# Patient Record
Sex: Female | Born: 1975 | Race: White | Hispanic: No | Marital: Single | Smoking: Current every day smoker
Health system: Southern US, Community
[De-identification: ages and names within clinical notes are randomized; demographics above are authoritative.]

## PROBLEM LIST (undated history)

## (undated) DIAGNOSIS — F419 Anxiety disorder, unspecified: Secondary | ICD-10-CM

## (undated) HISTORY — DX: Anxiety disorder, unspecified: F41.9

---

## 2008-03-02 HISTORY — PX: COLPOSCOPY: SHX161

## 2015-02-18 ENCOUNTER — Ambulatory Visit
Admission: EM | Admit: 2015-02-18 | Discharge: 2015-02-18 | Disposition: A | Discharge status: Home / Self Care | Payer: BLUE CROSS/BLUE SHIELD | Attending: Emergency Medicine | Admitting: Emergency Medicine

## 2015-02-18 DIAGNOSIS — S0501XA Injury of conjunctiva and corneal abrasion without foreign body, right eye, initial encounter: Secondary | ICD-10-CM | POA: Diagnosis not present

## 2015-02-18 MED ORDER — TETRACAINE HCL 0.5 % OP SOLN
2.0000 [drp] | Freq: Once | OPHTHALMIC | Status: AC
  Administered 2015-02-18: 2 [drp] via OPHTHALMIC

## 2015-02-18 MED ORDER — FLUORESCEIN SODIUM 1 MG OP STRP
1.0000 | ORAL_STRIP | Freq: Once | OPHTHALMIC | Status: AC
  Administered 2015-02-18: 1 via OPHTHALMIC

## 2015-02-18 MED ORDER — ERYTHROMYCIN 5 MG/GM OP OINT: 1.0000 "application " | TOPICAL_OINTMENT | Freq: Four times a day (QID) | OPHTHALMIC | Status: DC

## 2015-02-18 MED ORDER — TETANUS-DIPHTH-ACELL PERTUSSIS 5-2.5-18.5 LF-MCG/0.5 IM SUSP
0.5000 mL | Freq: Once | INTRAMUSCULAR | Status: AC
  Administered 2015-02-18: 0.5 mL via INTRAMUSCULAR

## 2015-02-18 NOTE — ED Provider Notes (Signed)
[facility]  ____________________________________________  Time seen: Approximately 1:33 PM  I have reviewed the triage vital signs and the nursing notes.   HISTORY  Chief Complaint Eye Problem   HPI Wendy Sexton is a 39 y.o. female presents with complaint of right eye redness and irritation. Patient reports that onset of this was Saturday evening. Patient states that she's been having some issues with her contacts causing her eyes to feel dry and more irritated than normal. Patient states that this is been going on for several months and she has been seeing her eye doctor for the same. Patient states that Saturday her eyes did feel dry and irritated with contact like normal, so she went to remove the right contact. Patient states that because her eye being more dry than normal she had some difficulty removing the contact. Patient states that after removal of the contact she had more right eye irritation and redness. States it feels like her eyes watering more than normal. Denies eye pain but states again is more of an irritation. Denies light sensitivity. Denies foreign body sensation. States current right eye discomfort is 2 out of 10 described as irritation.  Denies vision changes, foreign bodies, headache, chemical exposures or sick contact exposures. Denies headache, neck pain, back pain or other complaints. Reports unsure of last tetanus immunization.  Patient again noted that she has had similar current the past.  Ophthalmologist: ritter   History reviewed. No pertinent past medical history.  There are no active problems to display for this patient.   History reviewed. No pertinent past surgical history.  Current Outpatient Rx  Name  Route  Sig  Dispense  Refill  . ALPRAZolam (XANAX) 0.5 MG tablet   Oral   Take 0.5 mg by mouth at bedtime as needed for anxiety.           Allergies Review of patient's allergies indicates no known allergies.  Family History   Problem Relation Age of Onset  . Cancer Father     Social History Social History  Substance Use Topics  . Smoking status: Former Games developer  . Smokeless tobacco: None  . Alcohol Use: Yes     Comment: socially    Review of Systems Constitutional: No fever/chills Eyes: No visual changes. Right eye redness and irritation ENT: No sore throat. Cardiovascular: Denies chest pain. Respiratory: Denies shortness of breath. Gastrointestinal: No abdominal pain.  No nausea, no vomiting.  No diarrhea.  No constipation. Genitourinary: Negative for dysuria. Musculoskeletal: Negative for back pain. Skin: Negative for rash. Neurological: Negative for headaches, focal weakness or numbness.  10-point ROS otherwise negative.  ____________________________________________   PHYSICAL EXAM:  VITAL SIGNS: ED Triage Vitals  Enc Vitals Group     BP 02/18/15 1255 126/60 mmHg     Pulse Rate 02/18/15 1255 103 Recheck 84     Resp 02/18/15 1255 16     Temp 02/18/15 1255 97.5 F (36.4 C)     Temp Source 02/18/15 1255 Tympanic     SpO2 02/18/15 1255 100 %     Weight 02/18/15 1255 180 lb (81.647 kg)     Height 02/18/15 1255  (1.676 m)     Head Cir --      Peak Flow --      Pain Score 02/18/15 1257 4     Pain Loc --      Pain Edu? --      Excl. in GC? --    Visual acuity: Right 20/40,  left 20/40 With correction.   Constitutional: Alert and oriented. Well appearing and in no acute distress. Eyes: Visual acuity--see nursing documentation; No globe trauma; Eyelids normal to inspection;  Conjunctiva and sclera: Right: mild injection, Left: normal; Corneas: right examined with fluorescein with right corneal abrasion noted at 7 o'clock; EOM's intact;no pain with EOMs;  Pupils PERRLA; Anterior Chambers normal with limited exam. No surrounding erythema or swelling. No exudate or drainage bilaterally. No papilledema visualized bilaterally.  Head: Atraumatic.  Ears: no erythema, normal TMs  bilaterally.   Nose: No congestion/rhinnorhea.  Mouth/Throat: Mucous membranes are moist.  Oropharynx non-erythematous.No tonsillar swelling or exudate.  Neck: No stridor.  No cervical spine tenderness to palpation Hematological/Lymphatic/Immunilogical: No cervical lymphadenopathy. Cardiovascular: Normal rate, regular rhythm. Grossly normal heart sounds.  Good peripheral circulation. Respiratory: Normal respiratory effort.  No retractions. Lungs CTAB. Gastrointestinal: Soft and nontender. No distention. Normal Bowel sounds.   Musculoskeletal: No lower or upper extremity tenderness nor edema.   Neurologic:  Normal speech and language. No gross focal neurologic deficits are appreciated. No gait instability. Skin:  Skin is warm, dry and intact. No rash noted. Psychiatric: Mood and affect are normal. Speech and behavior are normal.  ____________________________________________   LABS (all labs ordered are listed, but only abnormal results are displayed)  Labs Reviewed - No data to display  PROCEDURES  Procedure(s) performed:   Eye exam Procedure explained and verbal consent obtained.  Anesthesia: tetracaine ophthalmic 2 drops Right eye examined with fluorescein strip.  No foreign bodies visualized. Right corneal abrasion at 7 o'clock, small.  Patient tolerated well.   ____________________________________________   INITIAL IMPRESSION / ASSESSMENT AND PLAN / ED COURSE  Pertinent labs & imaging results that were available during my care of the patient were reviewed by me and considered in my medical decision making (see chart for details).  Very well-appearing patient. No acute distress. Presents for the complaint of 2 days of right eye redness, irritation post removal of contact. Denies trauma, foreign bodies, chemical or other exposures. Denies vision changes. Reports does not use contacts since taking out Saturday. Eye exam completed. Small right corneal abrasion at 7:00, no  foreign bodies noted, no rust ring. Tetanus immunization updated. Will treat with erythromycin ophthalmic ointment and close follow-up. Patient states that she will follow up in the next day or two with her ophthalmologist. Strict follow-up and return parameters and precautions given including return immediately or seek immediate care for eye pain, vision changes, trauma, dizziness, new or worsening concerns.  Discussed follow up with Primary care physician this week. Discussed follow up and return parameters including no resolution or any worsening concerns. Patient verbalized understanding and agreed to plan.   ____________________________________________   FINAL CLINICAL IMPRESSION(S) / ED DIAGNOSES  Final diagnoses:  Right corneal abrasion, initial encounter       Renford DillsLindsey Yvett Rossel, NP 02/18/15 1519

## 2015-02-18 NOTE — ED Notes (Signed)
States slept Saturday afternoon with contact lenses in place. When woke, eyes were dry, removed contact and then felt burning. + redness and watering since

## 2015-02-18 NOTE — Discharge Instructions (Signed)
His medication as prescribed. Continue to wear glasses and does not use contacts at this time. Follow-up with your ophthalmologist as discussed.   Return to urgent care or proceed to ER for vision changes, increased redness, eye pain, facial swelling, new or worsening concerns.  Corneal Abrasion The cornea is the clear covering at the front and center of the eye. When looking at the colored portion of the eye (iris), you are looking through the cornea. This very thin tissue is made up of many layers. The surface layer is a single layer of cells (corneal epithelium) and is one of the most sensitive tissues in the body. If a scratch or injury causes the corneal epithelium to come off, it is called a corneal abrasion. If the injury extends to the tissues below the epithelium, the condition is called a corneal ulcer. CAUSES   Scratches.  Trauma.  Foreign body in the eye. Some people have recurrences of abrasions in the area of the original injury even after it has healed (recurrent erosion syndrome). Recurrent erosion syndrome generally improves and goes away with time. SYMPTOMS   Eye pain.  Difficulty or inability to keep the injured eye open.  The eye becomes very sensitive to light.  Recurrent erosions tend to happen suddenly, first thing in the morning, usually after waking up and opening the eye. DIAGNOSIS  Your health care provider can diagnose a corneal abrasion during an eye exam. Dye is usually placed in the eye using a drop or a small paper strip moistened by your tears. When the eye is examined with a special light, the abrasion shows up clearly because of the dye. TREATMENT   Small abrasions may be treated with antibiotic drops or ointment alone.  A pressure patch may be put over the eye. If this is done, follow your doctor's instructions for when to remove the patch. Do not drive or use machines while the eye patch is on. Judging distances is hard to do with a patch on. If the  abrasion becomes infected and spreads to the deeper tissues of the cornea, a corneal ulcer can result. This is serious because it can cause corneal scarring. Corneal scars interfere with light passing through the cornea and cause a loss of vision in the involved eye. HOME CARE INSTRUCTIONS  Use medicine or ointment as directed. Only take over-the-counter or prescription medicines for pain, discomfort, or fever as directed by your health care provider.  Do not drive or operate machinery if your eye is patched. Your ability to judge distances is impaired.  If your health care provider has given you a follow-up appointment, it is very important to keep that appointment. Not keeping the appointment could result in a severe eye infection or permanent loss of vision. If there is any problem keeping the appointment, let your health care provider know. SEEK MEDICAL CARE IF:   You have pain, light sensitivity, and a scratchy feeling in one eye or both eyes.  Your pressure patch keeps loosening up, and you can blink your eye under the patch after treatment.  Any kind of discharge develops from the eye after treatment or if the lids stick together in the morning.  You have the same symptoms in the morning as you did with the original abrasion days, weeks, or months after the abrasion healed.   This information is not intended to replace advice given to you by your health care provider. Make sure you discuss any questions you have with your health  care provider.   Document Released: 02/14/2000 Document Revised: 11/07/2014 Document Reviewed: 10/24/2012 Elsevier Interactive Patient Education [employer].

## 2017-03-19 ENCOUNTER — Encounter: Payer: Self-pay | Admitting: Nurse Practitioner

## 2017-03-19 ENCOUNTER — Ambulatory Visit: Payer: Self-pay | Admitting: Nurse Practitioner

## 2017-03-19 VITALS — BP 128/91 | HR 93 | Resp 16 | Ht 66.0 in | Wt 203.4 lb

## 2017-03-19 DIAGNOSIS — B354 Tinea corporis: Secondary | ICD-10-CM

## 2017-03-19 DIAGNOSIS — E039 Hypothyroidism, unspecified: Secondary | ICD-10-CM

## 2017-03-19 DIAGNOSIS — D529 Folate deficiency anemia, unspecified: Secondary | ICD-10-CM | POA: Insufficient documentation

## 2017-03-19 DIAGNOSIS — R945 Abnormal results of liver function studies: Secondary | ICD-10-CM | POA: Insufficient documentation

## 2017-03-19 DIAGNOSIS — F419 Anxiety disorder, unspecified: Secondary | ICD-10-CM

## 2017-03-19 DIAGNOSIS — F1020 Alcohol dependence, uncomplicated: Secondary | ICD-10-CM | POA: Insufficient documentation

## 2017-03-19 MED ORDER — ALPRAZOLAM 0.5 MG PO TABS: 0.5000 mg | ORAL_TABLET | Freq: Two times a day (BID) | ORAL | 3 refills | Status: DC | PRN

## 2017-03-19 MED ORDER — CLOTRIMAZOLE-BETAMETHASONE 1-0.05 % EX CREA: 1.0000 "application " | TOPICAL_CREAM | Freq: Two times a day (BID) | CUTANEOUS | 1 refills | Status: DC

## 2017-03-19 MED ORDER — LEVOTHYROXINE SODIUM 25 MCG PO TABS: 25.0000 ug | ORAL_TABLET | Freq: Every day | ORAL | 3 refills | Status: DC

## 2017-03-19 NOTE — Progress Notes (Signed)
[facility] [address] [place], [place] [address]  Internal MEDICINE  Office Visit Note  Patient Name: Wendy Sexton  [id number]  [id number]  Date of Service: 03/19/2017  Chief Complaint  Patient presents with  . Rash    present since september.     Other  This is a recurrent problem. The current episode started more than 1 month ago. The problem occurs constantly. The problem has been gradually worsening. Associated symptoms include a rash. Pertinent negatives include no abdominal pain, arthralgias, chest pain, chills, congestion, coughing, fatigue, joint swelling, nausea, neck pain, numbness, sore throat or vomiting. Exacerbated by: dry skin. Treatments tried: otc hydrocortisone cream.  The treatment provided mild relief.    Pt is here for routine follow up.    Current Medication: Outpatient Encounter Medications as of 03/19/2017  Medication Sig  . ALPRAZolam (XANAX) 0.5 MG tablet Take 0.5 mg by mouth 2 (two) times daily as needed for anxiety.   . Folic Acid-Vit B6-Vit B12 2.2-25-0.5 MG TABS Take by mouth daily.  Marland Kitchen levothyroxine (SYNTHROID, LEVOTHROID) 25 MCG tablet Take 25 mcg by mouth daily before breakfast.  . erythromycin ophthalmic ointment Place 1 application into the right eye 4 (four) times daily. For five days (Patient not taking: Reported on 03/19/2017)  . naltrexone (DEPADE) 50 MG tablet Take 50 mg by mouth daily.   No facility-administered encounter medications on file as of 03/19/2017.     Surgical History: No past surgical history on file.  Medical History: Past Medical History:  Diagnosis Date  . Anxiety     Family History: Family History  Problem Relation Age of Onset  . Hypertension Mother   . Cancer Father   . COPD Father   . Hypertension Father     Social History   Socioeconomic History  . Marital status: Single    Spouse name: Not on file  . Number of children: Not on file  . Years of education: Not on file  . Highest  education level: Not on file  Social Needs  . Financial resource strain: Not on file  . Food insecurity - worry: Not on file  . Food insecurity - inability: Not on file  . Transportation needs - medical: Not on file  . Transportation needs - non-medical: Not on file  Occupational History  . Not on file  Tobacco Use  . Smoking status: Former Games developer  . Smokeless tobacco: Never Used  Substance and Sexual Activity  . Alcohol use: Yes    Comment: socially  . Drug use: No  . Sexual activity: Yes  Other Topics Concern  . Not on file  Social History Narrative  . Not on file      Review of Systems  Constitutional: Negative for chills, fatigue and unexpected weight change.  HENT: Negative for congestion, postnasal drip, rhinorrhea, sneezing and sore throat.   Eyes: Negative for redness.  Respiratory: Negative for cough, chest tightness and shortness of breath.   Cardiovascular: Negative for chest pain and palpitations.  Gastrointestinal: Negative for abdominal pain, constipation, diarrhea, nausea and vomiting.  Genitourinary: Negative for dysuria and frequency.  Musculoskeletal: Negative for arthralgias, back pain, joint swelling and neck pain.  Skin: Positive for rash.       Reports rash all over arms, chest, and neck. Started as small spot of eczema on the hands and has gradually worsened over the past few months.   Neurological: Negative.  Negative for tremors and numbness.  Hematological: Negative for adenopathy. Does  not bruise/bleed easily.  Psychiatric/Behavioral: Negative for behavioral problems (Depression), sleep disturbance and suicidal ideas. The patient is not nervous/anxious.     Today's Vitals   03/19/17 1213  BP: (!) 128/91  Pulse: 93  Resp: 16  SpO2: 97%  Weight: 203 lb 6.4 oz (92.3 kg)  Height: 5\' 6"  (1.676 m)    Physical Exam  Constitutional: She is oriented to person, place, and time. She appears well-developed and well-nourished. No distress.  HENT:    Head: Normocephalic and atraumatic.  Mouth/Throat: Oropharynx is clear and moist. No oropharyngeal exudate.  Eyes: EOM are normal. Pupils are equal, round, and reactive to light.  Neck: Normal range of motion. Neck supple. No JVD present. No tracheal deviation present. No thyromegaly present.  Cardiovascular: Normal rate, regular rhythm and normal heart sounds. Exam reveals no gallop and no friction rub.  No murmur heard. Pulmonary/Chest: Effort normal. No respiratory distress. She has no wheezes. She has no rales. She exhibits no tenderness.  Abdominal: Soft. Bowel sounds are normal.  Musculoskeletal: Normal range of motion.  Lymphadenopathy:    She has no cervical adenopathy.  Neurological: She is alert and oriented to person, place, and time. No cranial nerve deficit.  Skin: Skin is warm and dry. Rash noted. Rash is macular and papular. She is not diaphoretic.     Psychiatric: She has a normal mood and affect. Her behavior is normal. Judgment and thought content normal.  Nursing note and vitals reviewed.   Assessment/Plan:  1. Tinea corporis - clotrimazole-betamethasone (LOTRISONE) cream; Apply 1 application topically 2 (two) times daily.  Dispense: 90 g; Refill: 1  2. Hypothyroidism, unspecified type - levothyroxine (SYNTHROID, LEVOTHROID) 25 MCG tablet; Take 1 tablet (25 mcg total) by mouth daily before breakfast.  Dispense: 30 tablet; Refill: 3  3. Anxiety - ALPRAZolam (XANAX) 0.5 MG tablet; Take 1 tablet (0.5 mg total) by mouth 2 (two) times daily as needed for anxiety.  Dispense: 60 tablet; Refill: 3   General Counseling: [name] verbalizes understanding of the findings of todays visit and agrees with plan of treatment. I have discussed any further diagnostic evaluation that may be needed or ordered today. We also reviewed her medications today. she has been encouraged to call the office with any questions or concerns that should arise related to todays visit.   This  patient was seen by Vincent GrosHeather Jariya Reichow, FNP- C in Collaboration with Dr Lyndon CodeFozia M Khan as a part of collaborative care agreement    Time spent: 15 Minutes          Dr Lyndon CodeFozia M Khan Internal medicine

## 2017-08-04 ENCOUNTER — Other Ambulatory Visit: Payer: Self-pay | Admitting: Nurse Practitioner

## 2017-08-04 DIAGNOSIS — E039 Hypothyroidism, unspecified: Secondary | ICD-10-CM

## 2017-08-04 MED ORDER — LEVOTHYROXINE SODIUM 25 MCG PO TABS: 25.0000 ug | ORAL_TABLET | Freq: Every day | ORAL | 3 refills | Status: DC

## 2017-08-13 ENCOUNTER — Ambulatory Visit: Payer: Self-pay | Admitting: Nurse Practitioner

## 2017-10-18 ENCOUNTER — Other Ambulatory Visit: Payer: Self-pay

## 2017-10-18 DIAGNOSIS — B354 Tinea corporis: Secondary | ICD-10-CM

## 2017-10-19 MED ORDER — CLOTRIMAZOLE-BETAMETHASONE 1-0.05 % EX CREA: 1.0000 "application " | TOPICAL_CREAM | Freq: Two times a day (BID) | CUTANEOUS | 1 refills | Status: DC

## 2018-01-26 ENCOUNTER — Other Ambulatory Visit: Payer: Self-pay

## 2018-01-26 DIAGNOSIS — E039 Hypothyroidism, unspecified: Secondary | ICD-10-CM

## 2018-01-26 MED ORDER — LEVOTHYROXINE SODIUM 25 MCG PO TABS: 25.0000 ug | ORAL_TABLET | Freq: Every day | ORAL | 0 refills | Status: DC

## 2018-01-26 NOTE — Telephone Encounter (Signed)
Lmom pt need appt for refills

## 2018-02-09 ENCOUNTER — Other Ambulatory Visit: Payer: Self-pay

## 2018-02-09 ENCOUNTER — Telehealth: Payer: Self-pay

## 2018-02-09 DIAGNOSIS — E039 Hypothyroidism, unspecified: Secondary | ICD-10-CM

## 2018-02-09 MED ORDER — LEVOTHYROXINE SODIUM 25 MCG PO TABS: 25.0000 ug | ORAL_TABLET | Freq: Every day | ORAL | 0 refills | Status: DC

## 2018-02-09 NOTE — Telephone Encounter (Signed)
Lmom pt need appt for refills  

## 2018-02-18 ENCOUNTER — Telehealth: Payer: Self-pay

## 2018-02-18 ENCOUNTER — Other Ambulatory Visit: Payer: Self-pay

## 2018-02-18 DIAGNOSIS — E039 Hypothyroidism, unspecified: Secondary | ICD-10-CM

## 2018-02-18 NOTE — Telephone Encounter (Signed)
LMOM FOR PT AND TOLD HER TO GIVE ME A CALL BACK IN REGARDS TO HER REFILL. PT NEEDS TO BE SEEN TO GET REFILLS.

## 2019-03-02 ENCOUNTER — Ambulatory Visit: Payer: Managed Care, Other (non HMO) | Attending: Internal Medicine

## 2019-03-02 DIAGNOSIS — Z20822 Contact with and (suspected) exposure to covid-19: Secondary | ICD-10-CM

## 2019-03-08 LAB — NOVEL CORONAVIRUS, NAA

## 2019-04-06 ENCOUNTER — Ambulatory Visit: Payer: Self-pay | Admitting: Obstetrics and Gynecology

## 2019-04-12 ENCOUNTER — Telehealth: Payer: Self-pay

## 2019-04-12 NOTE — Telephone Encounter (Signed)
LMOM TO CONFIRM AND SCREEN FOR 04-14-19 OV. 

## 2019-04-14 ENCOUNTER — Ambulatory Visit (INDEPENDENT_AMBULATORY_CARE_PROVIDER_SITE_OTHER): Payer: Managed Care, Other (non HMO) | Admitting: Nurse Practitioner

## 2019-04-14 ENCOUNTER — Encounter: Payer: Self-pay | Admitting: Nurse Practitioner

## 2019-04-14 ENCOUNTER — Other Ambulatory Visit: Payer: Self-pay

## 2019-04-14 VITALS — BP 123/79 | HR 110 | Temp 97.2°F | Resp 16 | Ht 66.0 in | Wt 208.5 lb

## 2019-04-14 DIAGNOSIS — Z1231 Encounter for screening mammogram for malignant neoplasm of breast: Secondary | ICD-10-CM

## 2019-04-14 DIAGNOSIS — F419 Anxiety disorder, unspecified: Secondary | ICD-10-CM | POA: Diagnosis not present

## 2019-04-14 DIAGNOSIS — Z124 Encounter for screening for malignant neoplasm of cervix: Secondary | ICD-10-CM

## 2019-04-14 DIAGNOSIS — R300 Dysuria: Secondary | ICD-10-CM

## 2019-04-14 DIAGNOSIS — Z0001 Encounter for general adult medical examination with abnormal findings: Secondary | ICD-10-CM | POA: Diagnosis not present

## 2019-04-14 DIAGNOSIS — Z23 Encounter for immunization: Secondary | ICD-10-CM | POA: Diagnosis not present

## 2019-04-14 MED ORDER — ALPRAZOLAM 0.5 MG PO TABS: 0.5000 mg | ORAL_TABLET | Freq: Two times a day (BID) | ORAL | 3 refills | Status: DC | PRN

## 2019-04-14 NOTE — Progress Notes (Signed)
[facility] [address] [place], [place] [address]  Internal MEDICINE  Office Visit Note  Patient Name: Wendy Sexton  [id number]  [id number]  Date of Service: 04/16/2019  Chief Complaint  Patient presents with  . Annual Exam  . Anxiety  . Hypothyroidism     The patient is here for health maintenance exam and pap smear. She states that she is doing well and has no concerns or complaints. She is due to have routine fasting labs done. She is also due to have screening mammogram. She would like to get her flu shot while she is here. She does have history of intermittent anxiety. She takes alprazolam 0.5mg  up to twice daily when needed. She needs to have a new prescription for this today.   Pt is here for routine health maintenance examination  Current Medication: Outpatient Encounter Medications as of 04/14/2019  Medication Sig  . ALPRAZolam (XANAX) 0.5 MG tablet Take 1 tablet (0.5 mg total) by mouth 2 (two) times daily as needed for anxiety.  . clotrimazole-betamethasone (LOTRISONE) cream Apply 1 application topically 2 (two) times daily.  Marland Kitchen erythromycin ophthalmic ointment Place 1 application into the right eye 4 (four) times daily. For five days  . Folic Acid-Vit B6-Vit B12 2.2-25-0.5 MG TABS Take by mouth daily.  Marland Kitchen levothyroxine (SYNTHROID, LEVOTHROID) 25 MCG tablet Take 1 tablet (25 mcg total) by mouth daily before breakfast.  . naltrexone (DEPADE) 50 MG tablet Take 50 mg by mouth daily.  . [DISCONTINUED] ALPRAZolam (XANAX) 0.5 MG tablet Take 1 tablet (0.5 mg total) by mouth 2 (two) times daily as needed for anxiety.   No facility-administered encounter medications on file as of 04/14/2019.    Surgical History: Past Surgical History:  Procedure Laterality Date  . COLPOSCOPY  2010    Medical History: Past Medical History:  Diagnosis Date  . Anxiety     Family History: Family History  Problem Relation Age of Onset  . Hypertension Mother   .  Hypercholesterolemia Mother   . Cancer Father   . COPD Father   . Hypertension Father   . Hypercholesterolemia Father   . Heart disease Paternal Grandfather   . Hypercholesterolemia Paternal Grandfather   . Hypertension Paternal Grandfather       Review of Systems  Constitutional: Negative for activity change, chills, fatigue and unexpected weight change.  HENT: Negative for congestion, postnasal drip, rhinorrhea, sneezing and sore throat.   Respiratory: Negative for cough, chest tightness, shortness of breath and wheezing.   Cardiovascular: Negative for chest pain and palpitations.  Gastrointestinal: Negative for abdominal pain, constipation, diarrhea, nausea and vomiting.  Endocrine: Negative for cold intolerance, heat intolerance, polydipsia and polyuria.  Genitourinary: Negative for dysuria, frequency, menstrual problem and vaginal bleeding.  Musculoskeletal: Negative for arthralgias, back pain, joint swelling and neck pain.  Skin: Negative for rash.  Allergic/Immunologic: Negative for environmental allergies.  Neurological: Negative for dizziness, tremors, numbness and headaches.  Hematological: Negative for adenopathy. Does not bruise/bleed easily.  Psychiatric/Behavioral: Negative for behavioral problems (Depression), sleep disturbance and suicidal ideas. The patient is nervous/anxious.      Today's Vitals   04/14/19 0940  BP: 123/79  Pulse: (!) 110  Resp: 16  Temp: (!) 97.2 F (36.2 C)  SpO2: 98%  Weight: 208 lb 8 oz (94.6 kg)  Height: 5\' 6"  (1.676 m)   Body mass index is 33.65 kg/m.  Physical Exam Vitals and nursing note reviewed.  Constitutional:      General: She is not  in acute distress.    Appearance: Normal appearance. She is well-developed. She is not diaphoretic.  HENT:     Head: Normocephalic and atraumatic.     Nose: Nose normal.     Mouth/Throat:     Pharynx: No oropharyngeal exudate.  Eyes:     Conjunctiva/sclera: Conjunctivae normal.      Pupils: Pupils are equal, round, and reactive to light.  Neck:     Thyroid: No thyromegaly.     Vascular: No JVD.     Trachea: No tracheal deviation.  Cardiovascular:     Rate and Rhythm: Normal rate and regular rhythm.     Pulses: Normal pulses.     Heart sounds: Normal heart sounds. No murmur. No friction rub. No gallop.   Pulmonary:     Effort: Pulmonary effort is normal. No respiratory distress.     Breath sounds: Normal breath sounds. No wheezing or rales.  Chest:     Chest wall: No tenderness.     Breasts:        Right: Normal. No swelling, bleeding, inverted nipple, mass, nipple discharge, skin change or tenderness.        Left: Normal. No swelling, bleeding, inverted nipple, mass, nipple discharge, skin change or tenderness.  Abdominal:     General: Bowel sounds are normal.     Palpations: Abdomen is soft.     Tenderness: There is no abdominal tenderness.     Hernia: There is no hernia in the left inguinal area or right inguinal area.  Genitourinary:    General: Normal vulva.     Labia:        Right: No tenderness or lesion.        Left: No tenderness or lesion.      Vagina: Normal. No vaginal discharge, erythema or tenderness.     Cervix: No discharge, friability, lesion or erythema.     Uterus: Normal.      Adnexa: Right adnexa normal and left adnexa normal.     Comments: .No tenderness, masses, or organomeglay present during bimanual exam . Musculoskeletal:        General: Normal range of motion.     Cervical back: Normal range of motion and neck supple.  Lymphadenopathy:     Cervical: No cervical adenopathy.     Upper Body:     Right upper body: No axillary adenopathy.     Left upper body: No axillary adenopathy.     Lower Body: No right inguinal adenopathy. No left inguinal adenopathy.  Skin:    General: Skin is warm and dry.  Neurological:     Mental Status: She is alert and oriented to person, place, and time.     Cranial Nerves: No cranial nerve deficit.   Psychiatric:        Mood and Affect: Mood normal.        Behavior: Behavior normal.        Thought Content: Thought content normal.        Judgment: Judgment normal.    LABS: Recent Results (from the past 2160 hour(s))  Novel Coronavirus, NAA ([facility])     Status: None   Collection Time: 03/02/19 11:54 AM   Specimen: Nasopharyngeal(NP) swabs in vial transport medium   NASOPHARYNGE  TESTING  Result Value Ref Range   SARS-CoV-2, NAA CANCELED     Comment: Test not performed. Deterioration occurred during specimen handling.  Result canceled by the ancillary.   UA/M w/rflx Culture, Routine  Status: Abnormal   Collection Time: 04/14/19 12:00 AM   Specimen: GYN   GYN  Result Value Ref Range   Specific Gravity, UA 1.020 1.005 - 1.030   pH, UA 5.5 5.0 - 7.5   Color, UA Yellow Yellow   Appearance Ur Turbid (A) Clear   Leukocytes,UA 1+ (A) Negative   Protein,UA Trace Negative/Trace   Glucose, UA Negative Negative   Ketones, UA Trace (A) Negative   RBC, UA Negative Negative   Bilirubin, UA Negative Negative   Urobilinogen, Ur 0.2 0.2 - 1.0 mg/dL   Nitrite, UA Negative Negative   Microscopic Examination See below:     Comment: Microscopic was indicated and was performed.   Urinalysis Reflex Comment     Comment: This specimen has reflexed to a Urine Culture.  Microscopic Examination     Status: Abnormal   Collection Time: 04/14/19 12:00 AM   GYN  Result Value Ref Range   WBC, UA 11-30 (A) 0 - 5 /hpf   RBC None seen 0 - 2 /hpf   Epithelial Cells (non renal) 0-10 0 - 10 /hpf   Casts Present (A) None seen /lpf   Cast Type Hyaline casts N/A   Mucus, UA Present Not Estab.   Bacteria, UA Few None seen/Few  Urine Culture, Reflex     Status: None   Collection Time: 04/14/19 12:00 AM   GYN  Result Value Ref Range   Urine Culture, Routine Final report    Organism ID, Bacteria Comment     Comment: Culture shows less than 10,000 colony forming units of bacteria per milliliter of  urine. This colony count is not generally considered to be clinically significant.      Assessment/Plan: 1. Encounter for general adult medical examination with abnormal findings Annual health maintenance exam and pap smear today. Lab slip given to obtain routine, fasting labs.   2. Anxiety May take alprazolam 0.5mg  up to twice daily if needed for acute anxiety. New prescription sent to her pharmacy today.  - ALPRAZolam (XANAX) 0.5 MG tablet; Take 1 tablet (0.5 mg total) by mouth 2 (two) times daily as needed for anxiety.  Dispense: 60 tablet; Refill: 3  3. Routine cervical smear - IGP, Aptima HPV  4. Flu vaccine need - Flu Vaccine MDCK QUAD PF  5. Encounter for screening mammogram for malignant neoplasm of breast - MM DIGITAL SCREENING BILATERAL; Future  6. Dysuria - UA/M w/rflx Culture, Routine  General Counseling: Adiah verbalizes understanding of the findings of todays visit and agrees with plan of treatment. I have discussed any further diagnostic evaluation that may be needed or ordered today. We also reviewed her medications today. she has been encouraged to call the office with any questions or concerns that should arise related to todays visit.    Counseling:  This patient was seen by Vincent Gros FNP Collaboration with Dr Lyndon Code as a part of collaborative care agreement  Orders Placed This Encounter  Procedures  . Microscopic Examination  . Urine Culture, Reflex  . MM DIGITAL SCREENING BILATERAL  . Flu Vaccine MDCK QUAD PF  . UA/M w/rflx Culture, Routine    Meds ordered this encounter  Medications  . ALPRAZolam (XANAX) 0.5 MG tablet    Sig: Take 1 tablet (0.5 mg total) by mouth 2 (two) times daily as needed for anxiety.    Dispense:  60 tablet    Refill:  3    Order Specific Question:   Supervising Provider  Answer:   Lavera Guise [[id number]]    Total time spent: 40 Minutes  Time spent includes review of chart, medications, test results, and  follow up plan with the patient.     Lavera Guise, MD  Internal Medicine

## 2019-04-16 DIAGNOSIS — Z124 Encounter for screening for malignant neoplasm of cervix: Secondary | ICD-10-CM | POA: Insufficient documentation

## 2019-04-16 DIAGNOSIS — Z23 Encounter for immunization: Secondary | ICD-10-CM | POA: Insufficient documentation

## 2019-04-16 DIAGNOSIS — R300 Dysuria: Secondary | ICD-10-CM | POA: Insufficient documentation

## 2019-04-16 DIAGNOSIS — Z1231 Encounter for screening mammogram for malignant neoplasm of breast: Secondary | ICD-10-CM | POA: Insufficient documentation

## 2019-04-16 DIAGNOSIS — Z0001 Encounter for general adult medical examination with abnormal findings: Secondary | ICD-10-CM | POA: Insufficient documentation

## 2019-04-16 LAB — MICROSCOPIC EXAMINATION: RBC: NONE SEEN /hpf (ref 0–2)

## 2019-04-16 LAB — UA/M W/RFLX CULTURE, ROUTINE
Bilirubin, UA: NEGATIVE
Glucose, UA: NEGATIVE
Nitrite, UA: NEGATIVE
RBC, UA: NEGATIVE
Specific Gravity, UA: 1.02 (ref 1.005–1.030)
Urobilinogen, Ur: 0.2 mg/dL (ref 0.2–1.0)
pH, UA: 5.5 (ref 5.0–7.5)

## 2019-04-16 LAB — URINE CULTURE, REFLEX

## 2019-04-17 NOTE — Progress Notes (Signed)
Considered contamination

## 2019-04-19 ENCOUNTER — Telehealth: Payer: Self-pay

## 2019-04-19 LAB — IGP, APTIMA HPV: HPV Aptima: NEGATIVE

## 2019-04-19 NOTE — Progress Notes (Signed)
Hey. Can you let the patient know that her pap smear was normal. Thanks.

## 2019-04-19 NOTE — Telephone Encounter (Signed)
-----   Message from Carlean Jews, NP sent at 04/19/2019 10:17 AM EST ----- Hey. Can you let the patient know that her pap smear was normal. Thanks.

## 2019-04-19 NOTE — Telephone Encounter (Signed)
Patient was notified.

## 2019-04-19 NOTE — Progress Notes (Signed)
Lmom to pt call us back

## 2019-04-19 NOTE — Telephone Encounter (Signed)
error 

## 2019-05-29 ENCOUNTER — Ambulatory Visit
Admission: RE | Admit: 2019-05-29 | Discharge: 2019-05-29 | Disposition: A | Discharge status: Home / Self Care | Payer: Managed Care, Other (non HMO) | Source: Ambulatory Visit | Attending: Nurse Practitioner | Admitting: Nurse Practitioner

## 2019-05-29 DIAGNOSIS — Z1231 Encounter for screening mammogram for malignant neoplasm of breast: Secondary | ICD-10-CM | POA: Insufficient documentation

## 2019-05-30 ENCOUNTER — Other Ambulatory Visit: Payer: Self-pay | Admitting: Nurse Practitioner

## 2019-05-30 DIAGNOSIS — R928 Other abnormal and inconclusive findings on diagnostic imaging of breast: Secondary | ICD-10-CM

## 2019-05-30 DIAGNOSIS — N6489 Other specified disorders of breast: Secondary | ICD-10-CM

## 2019-05-30 NOTE — Progress Notes (Signed)
Additional images requested

## 2019-06-23 ENCOUNTER — Ambulatory Visit
Admission: RE | Admit: 2019-06-23 | Discharge: 2019-06-23 | Disposition: A | Discharge status: Home / Self Care | Payer: Managed Care, Other (non HMO) | Source: Ambulatory Visit | Attending: Nurse Practitioner | Admitting: Nurse Practitioner

## 2019-06-23 DIAGNOSIS — N6489 Other specified disorders of breast: Secondary | ICD-10-CM

## 2019-06-23 DIAGNOSIS — R928 Other abnormal and inconclusive findings on diagnostic imaging of breast: Secondary | ICD-10-CM

## 2019-06-27 NOTE — Progress Notes (Signed)
Likely benign findings. Review at next visit .

## 2019-06-27 NOTE — Progress Notes (Signed)
Probably benign. Discuss in detail at next visit.

## 2019-08-09 ENCOUNTER — Telehealth: Payer: Self-pay

## 2019-08-09 NOTE — Telephone Encounter (Signed)
Lmom to confirm and screen for 08-11-19 ov. °

## 2019-08-10 ENCOUNTER — Telehealth: Payer: Self-pay

## 2019-08-10 NOTE — Telephone Encounter (Signed)
Patient rescheduled appointment on 08/14/2019 to 08/25/2019. klh

## 2019-08-11 ENCOUNTER — Ambulatory Visit: Payer: Managed Care, Other (non HMO) | Admitting: Nurse Practitioner

## 2019-08-24 ENCOUNTER — Telehealth: Payer: Self-pay

## 2019-08-24 NOTE — Telephone Encounter (Signed)
Patient rescheduled appointment on 08/25/2019 to 09/07/2019. klh

## 2019-08-25 ENCOUNTER — Ambulatory Visit: Payer: Managed Care, Other (non HMO) | Admitting: Nurse Practitioner

## 2019-09-05 ENCOUNTER — Telehealth: Payer: Self-pay

## 2019-09-05 NOTE — Telephone Encounter (Signed)
Lmom to confirm and screen for 09-07-19 ov. 

## 2019-09-07 ENCOUNTER — Ambulatory Visit: Payer: Managed Care, Other (non HMO) | Admitting: Nurse Practitioner

## 2019-09-07 ENCOUNTER — Telehealth: Payer: Self-pay

## 2019-09-07 NOTE — Telephone Encounter (Signed)
Patient rescheduled appointment on 09/06/2020 to 10/20/2019. klh

## 2019-10-18 ENCOUNTER — Telehealth: Payer: Self-pay

## 2019-10-18 NOTE — Telephone Encounter (Signed)
LMOM for office visit on 8/20 °

## 2019-10-20 ENCOUNTER — Ambulatory Visit: Payer: Managed Care, Other (non HMO) | Admitting: Nurse Practitioner

## 2019-11-20 ENCOUNTER — Other Ambulatory Visit: Payer: Self-pay | Admitting: Nurse Practitioner

## 2019-11-20 DIAGNOSIS — N6489 Other specified disorders of breast: Secondary | ICD-10-CM

## 2019-12-25 ENCOUNTER — Other Ambulatory Visit: Payer: Self-pay

## 2019-12-25 ENCOUNTER — Ambulatory Visit
Admission: RE | Admit: 2019-12-25 | Discharge: 2019-12-25 | Disposition: A | Discharge status: Home / Self Care | Payer: Managed Care, Other (non HMO) | Source: Ambulatory Visit | Attending: Nurse Practitioner | Admitting: Nurse Practitioner

## 2019-12-25 DIAGNOSIS — N6489 Other specified disorders of breast: Secondary | ICD-10-CM | POA: Insufficient documentation

## 2019-12-26 NOTE — Progress Notes (Signed)
Stable. Followed every 6 months.

## 2020-01-31 ENCOUNTER — Other Ambulatory Visit: Payer: Self-pay | Admitting: Nurse Practitioner

## 2020-02-01 LAB — CBC
Hematocrit: 39.3 % (ref 34.0–46.6)
Hemoglobin: 12.5 g/dL (ref 11.1–15.9)
MCH: 24.8 pg — ABNORMAL LOW (ref 26.6–33.0)
MCHC: 31.8 g/dL (ref 31.5–35.7)
MCV: 78 fL — ABNORMAL LOW (ref 79–97)
Platelets: 449 10*3/uL (ref 150–450)
RBC: 5.04 x10E6/uL (ref 3.77–5.28)
RDW: 15.6 % — ABNORMAL HIGH (ref 11.7–15.4)
WBC: 11.3 10*3/uL — ABNORMAL HIGH (ref 3.4–10.8)

## 2020-02-01 LAB — IRON AND TIBC
Iron Saturation: 6 % — CL (ref 15–55)
Iron: 26 ug/dL — ABNORMAL LOW (ref 27–159)
Total Iron Binding Capacity: 428 ug/dL (ref 250–450)
UIBC: 402 ug/dL (ref 131–425)

## 2020-02-01 LAB — T3: T3, Total: 103 ng/dL (ref 71–180)

## 2020-02-01 LAB — COMPREHENSIVE METABOLIC PANEL
ALT: 22 IU/L (ref 0–32)
AST: 21 IU/L (ref 0–40)
Albumin/Globulin Ratio: 1.9 (ref 1.2–2.2)
Albumin: 4.7 g/dL (ref 3.8–4.8)
Alkaline Phosphatase: 76 IU/L (ref 44–121)
BUN/Creatinine Ratio: 19 (ref 9–23)
BUN: 12 mg/dL (ref 6–24)
Bilirubin Total: 0.2 mg/dL (ref 0.0–1.2)
CO2: 17 mmol/L — ABNORMAL LOW (ref 20–29)
Calcium: 9.4 mg/dL (ref 8.7–10.2)
Chloride: 104 mmol/L (ref 96–106)
Creatinine, Ser: 0.64 mg/dL (ref 0.57–1.00)
GFR calc Af Amer: 125 mL/min/{1.73_m2} (ref >59)
GFR calc non Af Amer: 108 mL/min/{1.73_m2} (ref >59)
Globulin, Total: 2.5 g/dL (ref 1.5–4.5)
Glucose: 91 mg/dL (ref 65–99)
Potassium: 4.6 mmol/L (ref 3.5–5.2)
Sodium: 138 mmol/L (ref 134–144)
Total Protein: 7.2 g/dL (ref 6.0–8.5)

## 2020-02-01 LAB — TSH: TSH: 4.73 u[IU]/mL — ABNORMAL HIGH (ref 0.450–4.500)

## 2020-02-01 LAB — B12 AND FOLATE PANEL
Folate: 5.1 ng/mL (ref >3.0)
Vitamin B-12: 377 pg/mL (ref 232–1245)

## 2020-02-01 LAB — LIPID PANEL W/O CHOL/HDL RATIO
Cholesterol, Total: 254 mg/dL — ABNORMAL HIGH (ref 100–199)
HDL: 54 mg/dL (ref >39)
LDL Chol Calc (NIH): 143 mg/dL — ABNORMAL HIGH (ref 0–99)
Triglycerides: 315 mg/dL — ABNORMAL HIGH (ref 0–149)
VLDL Cholesterol Cal: 57 mg/dL — ABNORMAL HIGH (ref 5–40)

## 2020-02-01 LAB — VITAMIN D 25 HYDROXY (VIT D DEFICIENCY, FRACTURES): Vit D, 25-Hydroxy: 12.3 ng/mL — ABNORMAL LOW (ref 30.0–100.0)

## 2020-02-01 LAB — FERRITIN: Ferritin: 8 ng/mL — ABNORMAL LOW (ref 15–150)

## 2020-02-01 LAB — T4, FREE: Free T4: 0.91 ng/dL (ref 0.82–1.77)

## 2020-02-01 NOTE — Progress Notes (Signed)
Hey. My last visit with this patient was 04/2019. She just got labs done and she has visit with you 12/6. Ferritin is very low. Hgb and Hct normal. She does have low/normal B12, and vitamin d is low. Also, cholesterol panel is elevated.

## 2020-02-05 ENCOUNTER — Encounter: Payer: Self-pay | Admitting: Hospice and Palliative Medicine

## 2020-02-05 ENCOUNTER — Ambulatory Visit: Payer: Managed Care, Other (non HMO) | Admitting: Internal Medicine

## 2020-02-05 ENCOUNTER — Other Ambulatory Visit: Payer: Self-pay

## 2020-02-05 VITALS — BP 110/88 | HR 90 | Temp 98.4°F | Resp 16 | Ht 66.0 in | Wt 181.4 lb

## 2020-02-05 DIAGNOSIS — F10280 Alcohol dependence with alcohol-induced anxiety disorder: Secondary | ICD-10-CM

## 2020-02-05 DIAGNOSIS — F411 Generalized anxiety disorder: Secondary | ICD-10-CM

## 2020-02-05 DIAGNOSIS — E559 Vitamin D deficiency, unspecified: Secondary | ICD-10-CM

## 2020-02-05 DIAGNOSIS — E782 Mixed hyperlipidemia: Secondary | ICD-10-CM

## 2020-02-05 DIAGNOSIS — F17210 Nicotine dependence, cigarettes, uncomplicated: Secondary | ICD-10-CM

## 2020-02-05 DIAGNOSIS — D500 Iron deficiency anemia secondary to blood loss (chronic): Secondary | ICD-10-CM | POA: Diagnosis not present

## 2020-02-05 DIAGNOSIS — E039 Hypothyroidism, unspecified: Secondary | ICD-10-CM

## 2020-02-05 MED ORDER — HEMOCYTE-PLUS 106-1 MG PO TABS: ORAL_TABLET | ORAL | 3 refills | Status: DC

## 2020-02-05 MED ORDER — LEVOTHYROXINE SODIUM 50 MCG PO TABS: 50.0000 ug | ORAL_TABLET | Freq: Every day | ORAL | 3 refills | Status: DC

## 2020-02-05 MED ORDER — ERGOCALCIFEROL 1.25 MG (50000 UT) PO CAPS: ORAL_CAPSULE | ORAL | 3 refills | Status: DC

## 2020-02-05 MED ORDER — BUPROPION HCL ER (XL) 150 MG PO TB24: 150.0000 mg | ORAL_TABLET | Freq: Every day | ORAL | 1 refills | Status: DC

## 2020-02-05 NOTE — Progress Notes (Signed)
[facility] [address] [place], [place] [address]  Internal MEDICINE  Office Visit Note  Patient Name: Wendy Sexton  [id number]  [id number]  Date of Service: 02/05/2020  Chief Complaint  Patient presents with  . Follow-up  . Nicotine Dependence    discuss smoking cessation, medications  . controlled substance policy    scanned    HPI Pt is here for follow up 1. Wants to quit smoking, has tried Chantix in the past but very expensive with insurance, will like to try something else. She is smoking 1PPD at the moment 2.She consumes alcohol heavily as well and wants help that too ( naltrexone)  3. Recent labs show multiple abnormalities with low iron, abnormal lipid profile and Elevated TSH, Vit D is low as well 4. Admits heavy menstrual flow lasting for 2 days. No family history of colon cancer     Current Medication: Outpatient Encounter Medications as of 02/05/2020  Medication Sig  . ALPRAZolam (XANAX) 0.5 MG tablet Take 1 tablet (0.5 mg total) by mouth 2 (two) times daily as needed for anxiety.  . clotrimazole-betamethasone (LOTRISONE) cream Apply 1 application topically 2 (two) times daily.  . naltrexone (DEPADE) 50 MG tablet Take 50 mg by mouth daily.  . [DISCONTINUED] levothyroxine (SYNTHROID, LEVOTHROID) 25 MCG tablet Take 1 tablet (25 mcg total) by mouth daily before breakfast.  . buPROPion (WELLBUTRIN XL) 150 MG 24 hr tablet Take 1 tablet (150 mg total) by mouth daily.  Marland Kitchen levothyroxine (SYNTHROID) 50 MCG tablet Take 1 tablet (50 mcg total) by mouth daily.  . [DISCONTINUED] erythromycin ophthalmic ointment Place 1 application into the right eye 4 (four) times daily. For five days (Patient not taking: Reported on 02/05/2020)  . [DISCONTINUED] Folic Acid-Vit B6-Vit B12 2.2-25-0.5 MG TABS Take by mouth daily. (Patient not taking: Reported on 02/05/2020)   No facility-administered encounter medications on file as of 02/05/2020.    Surgical History: Past Surgical  History:  Procedure Laterality Date  . COLPOSCOPY  2010    Medical History: Past Medical History:  Diagnosis Date  . Anxiety     Family History: Family History  Problem Relation Age of Onset  . Hypertension Mother   . Hypercholesterolemia Mother   . Cancer Father   . COPD Father   . Hypertension Father   . Hypercholesterolemia Father   . Heart disease Paternal Grandfather   . Hypercholesterolemia Paternal Grandfather   . Hypertension Paternal Grandfather     Social History   Socioeconomic History  . Marital status: Single    Spouse name: Not on file  . Number of children: Not on file  . Years of education: Not on file  . Highest education level: Not on file  Occupational History  . Not on file  Tobacco Use  . Smoking status: Former Games developer  . Smokeless tobacco: Never Used  Vaping Use  . Vaping Use: Never used  Substance and Sexual Activity  . Alcohol use: Yes    Comment: socially  . Drug use: No  . Sexual activity: Yes  Other Topics Concern  . Not on file  Social History Narrative  . Not on file   Social Determinants of Health   Financial Resource Strain:   . Difficulty of Paying Living Expenses: Not on file  Food Insecurity:   . Worried About Programme researcher, broadcasting/film/video in the Last Year: Not on file  . Ran Out of Food in the Last Year: Not on file  Transportation Needs:   .  Lack of Transportation (Medical): Not on file  . Lack of Transportation (Non-Medical): Not on file  Physical Activity:   . Days of Exercise per Week: Not on file  . Minutes of Exercise per Session: Not on file  Stress:   . Feeling of Stress : Not on file  Social Connections:   . Frequency of Communication with Friends and Family: Not on file  . Frequency of Social Gatherings with Friends and Family: Not on file  . Attends Religious Services: Not on file  . Active Member of Clubs or Organizations: Not on file  . Attends Banker Meetings: Not on file  . Marital Status: Not  on file  Intimate Partner Violence:   . Fear of Current or Ex-Partner: Not on file  . Emotionally Abused: Not on file  . Physically Abused: Not on file  . Sexually Abused: Not on file      Review of Systems  Constitutional: Negative for chills, diaphoresis and fatigue.  HENT: Negative for ear pain, postnasal drip and sinus pressure.   Eyes: Negative for photophobia, discharge, redness, itching and visual disturbance.  Respiratory: Negative for cough, shortness of breath and wheezing.   Cardiovascular: Negative for chest pain, palpitations and leg swelling.  Gastrointestinal: Negative for abdominal pain, constipation, diarrhea, nausea and vomiting.  Genitourinary: Negative for dysuria and flank pain.  Musculoskeletal: Negative for arthralgias, back pain, gait problem and neck pain.  Skin: Negative for color change.  Allergic/Immunologic: Negative for environmental allergies and food allergies.  Neurological: Negative for dizziness and headaches.  Hematological: Does not bruise/bleed easily.  Psychiatric/Behavioral: Positive for agitation. Negative for behavioral problems (depression), hallucinations, self-injury and suicidal ideas. The patient is nervous/anxious.     Vital Signs: BP 110/88   Pulse 90   Temp 98.4 F (36.9 C)   Resp 16   Ht 5\' 6"  (1.676 m)   Wt 181 lb 6.4 oz (82.3 kg)   SpO2 98%   BMI 29.28 kg/m    Physical Exam Constitutional:      General: She is not in acute distress.    Appearance: She is well-developed. She is not diaphoretic.  HENT:     Head: Normocephalic and atraumatic.     Mouth/Throat:     Pharynx: No oropharyngeal exudate.  Eyes:     Pupils: Pupils are equal, round, and reactive to light.  Neck:     Thyroid: Thyromegaly present.     Vascular: No JVD.     Trachea: No tracheal deviation.  Cardiovascular:     Rate and Rhythm: Normal rate and regular rhythm.     Heart sounds: Normal heart sounds. No murmur heard.  No friction rub. No  gallop.   Pulmonary:     Effort: Pulmonary effort is normal. No respiratory distress.     Breath sounds: No wheezing or rales.  Chest:     Chest wall: No tenderness.  Abdominal:     General: Bowel sounds are normal.     Palpations: Abdomen is soft.  Musculoskeletal:        General: Normal range of motion.     Cervical back: Normal range of motion and neck supple.  Lymphadenopathy:     Cervical: No cervical adenopathy.  Skin:    General: Skin is warm and dry.  Neurological:     Mental Status: She is alert and oriented to person, place, and time.     Cranial Nerves: No cranial nerve deficit.  Psychiatric:  Behavior: Behavior normal.        Thought Content: Thought content normal.        Judgment: Judgment normal.        Assessment/Plan: 1. Hypothyroidism, unspecified type Pt does have thyroid enlargement on exa, will order thyroid u/s, start low dose Synthroid however elevated TG can alter that, will keep monitoring  - levothyroxine (SYNTHROID) 50 MCG tablet; Take 1 tablet (50 mcg total) by mouth daily.  Dispense: 90 tablet; Refill: 3 - US THYROID; Future  2. Iron deficiency anemia due to chronic blood loss Obvious Gyn loss, will satrt iron supplement, if no improvement might need GI evaluation   - Fe Fum-FA-B Cmp-C-Zn-Mg-Mn-Cu (HEMOCYTE-PLUS) 106-1 MG TABS; Take one tab a day for iron def anemia  Dispense: 30 tablet; Refill: 3  3. Cigarette nicotine dependence without complication Pt has excessive anxiety and dependence, will start on Wellbutrin, no h/o sz disorder  - buPROPion (WELLBUTRIN XL) 150 MG 24 hr tablet; Take 1 tablet (150 mg total) by mouth daily.  Dispense: 30 tablet; Refill: 1  4. 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 Mixed hyperlipidemia encouraged dietary changes for now, both smoking and alcohol cessation will help to ower those levels   6. Vitamin D deficiency -  ergocalciferol (DRISDOL) 1.25 MG (50000 UT) capsule; Take one cap q week  Dispense: 12 capsule; Refill: 3  7. GAD (generalized anxiety disorder) Will monitor, might need to be additional therapy, instructed explore further resources through her insurance for counseling and CBT   General Counseling: Cayenne verbalizes understanding of the findings of todays visit and agrees with plan of treatment. I have discussed any further diagnostic evaluation that may be needed or ordered today. We also reviewed her medications today. she has been encouraged to call the office with any questions or concerns that should arise related to todays visit.    Orders Placed This Encounter  Procedures  . US THYROID    Meds ordered this encounter  Medications  . buPROPion (WELLBUTRIN XL) 150 MG 24 hr tablet    Sig: Take 1 tablet (150 mg total) by mouth daily.    Dispense:  30 tablet    Refill:  1  . levothyroxine (SYNTHROID) 50 MCG tablet    Sig: Take 1 tablet (50 mcg total) by mouth daily.    Dispense:  90 tablet    Refill:  3    Total time spent:35 Minutes Time spent includes review of chart, medications, test results, and follow up plan with the patient.      Dr Lyndon Code Internal medicine

## 2020-02-27 ENCOUNTER — Other Ambulatory Visit: Payer: Self-pay | Admitting: Internal Medicine

## 2020-02-27 DIAGNOSIS — F17210 Nicotine dependence, cigarettes, uncomplicated: Secondary | ICD-10-CM

## 2020-03-13 ENCOUNTER — Other Ambulatory Visit: Payer: Self-pay

## 2020-03-13 ENCOUNTER — Ambulatory Visit: Payer: Managed Care, Other (non HMO)

## 2020-03-13 DIAGNOSIS — E042 Nontoxic multinodular goiter: Secondary | ICD-10-CM

## 2020-03-13 DIAGNOSIS — E039 Hypothyroidism, unspecified: Secondary | ICD-10-CM

## 2020-03-19 ENCOUNTER — Encounter: Payer: Self-pay | Admitting: Hospice and Palliative Medicine

## 2020-03-19 ENCOUNTER — Ambulatory Visit (INDEPENDENT_AMBULATORY_CARE_PROVIDER_SITE_OTHER): Payer: Managed Care, Other (non HMO) | Admitting: Hospice and Palliative Medicine

## 2020-03-19 VITALS — BP 140/82 | HR 103 | Temp 97.5°F | Resp 16 | Ht 66.0 in | Wt 181.0 lb

## 2020-03-19 DIAGNOSIS — F10280 Alcohol dependence with alcohol-induced anxiety disorder: Secondary | ICD-10-CM

## 2020-03-19 DIAGNOSIS — E039 Hypothyroidism, unspecified: Secondary | ICD-10-CM | POA: Diagnosis not present

## 2020-03-19 DIAGNOSIS — R000 Tachycardia, unspecified: Secondary | ICD-10-CM

## 2020-03-19 DIAGNOSIS — F17210 Nicotine dependence, cigarettes, uncomplicated: Secondary | ICD-10-CM

## 2020-03-19 MED ORDER — BUPROPION HCL ER (XL) 300 MG PO TB24: 300.0000 mg | ORAL_TABLET | Freq: Every day | ORAL | 1 refills | Status: DC

## 2020-03-19 NOTE — Progress Notes (Signed)
[facility] [address] [place], [place] [address]  Internal MEDICINE  Office Visit Note  Patient Name: Wendy Sexton  [id number]  [id number]  Date of Service: 03/20/2020  Chief Complaint  Patient presents with  . Follow-up    HPI Patient is here for routine follow-up At last visit she was started on low dose levothyroxine Reviewed thyroid US: normal size gland, hypoechoic nodule right lobe 0.3 cm, small colloid cyst 0.3 cm left lobe--recommend 1 year follow-up She has been tolerating low dose levothyroxine well, no negative side effects  She was also started on Wellbutrin to help with smoking cessation and to improve anxiety induced alcohol dependence Has cut back on her smoking--slowly working towards cessation Continues to drink about 6-8 drinks per night--has been on naltrexone in the past which helped with alcohol cravings  Current Medication: Outpatient Encounter Medications as of 03/19/2020  Medication Sig  . buPROPion (WELLBUTRIN XL) 300 MG 24 hr tablet Take 1 tablet (300 mg total) by mouth daily.  Marland Kitchen ALPRAZolam (XANAX) 0.5 MG tablet Take 1 tablet (0.5 mg total) by mouth 2 (two) times daily as needed for anxiety.  . clotrimazole-betamethasone (LOTRISONE) cream Apply 1 application topically 2 (two) times daily.  . ergocalciferol (DRISDOL) 1.25 MG (50000 UT) capsule Take one cap q week  . Fe Fum-FA-B Cmp-C-Zn-Mg-Mn-Cu (HEMOCYTE-PLUS) 106-1 MG TABS Take one tab a day for iron def anemia  . levothyroxine (SYNTHROID) 50 MCG tablet Take 1 tablet (50 mcg total) by mouth daily.  . naltrexone (DEPADE) 50 MG tablet Take 50 mg by mouth daily.  . [DISCONTINUED] buPROPion (WELLBUTRIN XL) 150 MG 24 hr tablet TAKE 1 TABLET BY MOUTH EVERY DAY   No facility-administered encounter medications on file as of 03/19/2020.    Surgical History: Past Surgical History:  Procedure Laterality Date  . COLPOSCOPY  2010    Medical History: Past Medical History:  Diagnosis Date  .  Anxiety     Family History: Family History  Problem Relation Age of Onset  . Hypertension Mother   . Hypercholesterolemia Mother   . Cancer Father   . COPD Father   . Hypertension Father   . Hypercholesterolemia Father   . Heart disease Paternal Grandfather   . Hypercholesterolemia Paternal Grandfather   . Hypertension Paternal Grandfather     Social History   Socioeconomic History  . Marital status: Single    Spouse name: Not on file  . Number of children: Not on file  . Years of education: Not on file  . Highest education level: Not on file  Occupational History  . Not on file  Tobacco Use  . Smoking status: Current Every Day Smoker  . Smokeless tobacco: Never Used  Vaping Use  . Vaping Use: Never used  Substance and Sexual Activity  . Alcohol use: Yes    Comment: socially  . Drug use: No  . Sexual activity: Yes  Other Topics Concern  . Not on file  Social History Narrative  . Not on file   Social Determinants of Health   Financial Resource Strain: Not on file  Food Insecurity: Not on file  Transportation Needs: Not on file  Physical Activity: Not on file  Stress: Not on file  Social Connections: Not on file  Intimate Partner Violence: Not on file      Review of Systems  Constitutional: Negative for chills, diaphoresis and fatigue.  HENT: Negative for ear pain, postnasal drip and sinus pressure.   Eyes: Negative for photophobia,  discharge, redness, itching and visual disturbance.  Respiratory: Negative for cough, shortness of breath and wheezing.   Cardiovascular: Negative for chest pain, palpitations and leg swelling.  Gastrointestinal: Negative for abdominal pain, constipation, diarrhea, nausea and vomiting.  Genitourinary: Negative for dysuria and flank pain.  Musculoskeletal: Negative for arthralgias, back pain, gait problem and neck pain.  Skin: Negative for color change.  Allergic/Immunologic: Negative for environmental allergies and food  allergies.  Neurological: Negative for dizziness and headaches.  Hematological: Does not bruise/bleed easily.  Psychiatric/Behavioral: Negative for agitation, behavioral problems (depression) and hallucinations. The patient is nervous/anxious.     Vital Signs: BP 140/82   Pulse (!) 103   Temp (!) 97.5 F (36.4 C)   Resp 16   Ht 5\' 6"  (1.676 m)   Wt 181 lb (82.1 kg)   SpO2 99%   BMI 29.21 kg/m    Physical Exam Vitals reviewed.  Constitutional:      Appearance: Normal appearance. She is normal weight.  Cardiovascular:     Rate and Rhythm: Regular rhythm. Tachycardia present.     Pulses: Normal pulses.     Heart sounds: Normal heart sounds.  Pulmonary:     Effort: Pulmonary effort is normal.     Breath sounds: Normal breath sounds.  Abdominal:     General: Abdomen is flat.     Palpations: Abdomen is soft.  Musculoskeletal:        General: Normal range of motion.     Cervical back: Normal range of motion.  Skin:    General: Skin is warm.  Neurological:     General: No focal deficit present.     Mental Status: She is alert and oriented to person, place, and time. Mental status is at baseline.  Psychiatric:        Mood and Affect: Mood normal.        Behavior: Behavior normal.        Thought Content: Thought content normal.        Judgment: Judgment normal.    Assessment/Plan: 1. Hypothyroidism, unspecified type Recheck thyroid levels as she has been on low dose Synthroid for 6 weeks a this time Antibody testing for possible Hashimoto's thyroiditis - Thyroglobulin antibody - Thyroid peroxidase antibody; Future - TSH + free T4  2. Cigarette nicotine dependence without complication Increase dose of Wellbutrin to 300 mg daily to help with smoking cessation - buPROPion (WELLBUTRIN XL) 300 MG 24 hr tablet; Take 1 tablet (300 mg total) by mouth daily.  Dispense: 90 tablet; Refill: 1  3. 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  Dispense: 90 tablet; Refill: 1  4. Tachycardia Consider EKG at next visit to assess underlying rhythm Possibly related to hypothyroidism or excessive ETOH--may consider low dose beta blocker  General Counseling: Trey verbalizes understanding of the findings of todays visit and agrees with plan of treatment. I have discussed any further diagnostic evaluation that may be needed or ordered today. We also reviewed her medications today. she has been encouraged to call the office with any questions or concerns that should arise related to todays visit.  Smoking cessation counseling: 1. Pt acknowledges the risks of long term smoking, she will try to quite smoking. 2. Options for different medications including nicotine products, chewing gum, patch etc, Wellbutrin and Chantix  is discussed 3. Goal and date of compete cessation is discussed 4. Total time spent in smoking cessation is 15 min.   Orders Placed This Encounter  Procedures  . Thyroglobulin antibody  . Thyroid peroxidase antibody  . TSH + free T4    Meds ordered this encounter  Medications  . buPROPion (WELLBUTRIN XL) 300 MG 24 hr tablet    Sig: Take 1 tablet (300 mg total) by mouth daily.    Dispense:  90 tablet    Refill:  1    Time spent: 30 Minutes Time spent includes review of chart, medications, test results and follow-up plan with the patient.  This patient was seen by Leeanne Deed AGNP-C in Collaboration with Dr Lyndon Code as a part of collaborative care agreement     Lubertha Basque. Sandor Arboleda AGNP-C Internal medicine

## 2020-03-20 ENCOUNTER — Encounter: Payer: Self-pay | Admitting: Hospice and Palliative Medicine

## 2020-04-17 ENCOUNTER — Ambulatory Visit: Payer: Managed Care, Other (non HMO) | Admitting: Hospice and Palliative Medicine

## 2020-07-01 ENCOUNTER — Ambulatory Visit: Payer: Managed Care, Other (non HMO) | Admitting: Hospice and Palliative Medicine

## 2020-07-02 LAB — TSH+FREE T4
Free T4: 1.02 ng/dL (ref 0.82–1.77)
TSH: 2.66 u[IU]/mL (ref 0.450–4.500)

## 2020-07-08 ENCOUNTER — Encounter: Payer: Self-pay | Admitting: Physician Assistant

## 2020-07-08 ENCOUNTER — Other Ambulatory Visit: Payer: Self-pay

## 2020-07-08 ENCOUNTER — Ambulatory Visit (INDEPENDENT_AMBULATORY_CARE_PROVIDER_SITE_OTHER): Payer: Managed Care, Other (non HMO) | Admitting: Physician Assistant

## 2020-07-08 DIAGNOSIS — R0602 Shortness of breath: Secondary | ICD-10-CM

## 2020-07-08 DIAGNOSIS — R000 Tachycardia, unspecified: Secondary | ICD-10-CM

## 2020-07-08 DIAGNOSIS — R0789 Other chest pain: Secondary | ICD-10-CM

## 2020-07-08 DIAGNOSIS — F411 Generalized anxiety disorder: Secondary | ICD-10-CM

## 2020-07-08 DIAGNOSIS — F10280 Alcohol dependence with alcohol-induced anxiety disorder: Secondary | ICD-10-CM | POA: Diagnosis not present

## 2020-07-08 DIAGNOSIS — E039 Hypothyroidism, unspecified: Secondary | ICD-10-CM | POA: Diagnosis not present

## 2020-07-08 NOTE — Progress Notes (Signed)
[facility] [address] [place], [place] [address]  Internal MEDICINE  Office Visit Note  Patient Name: Wendy Sexton  [id number]  [id number]  Date of Service: 07/10/2020  Chief Complaint  Patient presents with  . Follow-up    Review lab, cologuard request, meds review    HPI Pt is here for follow up -Had an [place] of thyroid and found some small nodule and small cyst that were likely benign and was supposed to have test for hashimoto, however antibody lab not drawn--will sent order again. Repeat US in 1 year -Quit smoking 2.5 months ago -Interested in naltrexone for alcohol use. Drinks 8-10 drinks per day, worse since stopping smoking. Had previously been on naltrexone and will be referred to psych and given resources for detox and for naltrexone consideration. -Not on wellbutrin anymore because it made anxiety worse so stopped it once she had quit smoking. HR initially fast an dhas been high in past so will get EKG to evaluate today  Current Medication: Outpatient Encounter Medications as of 07/08/2020  Medication Sig  . ALPRAZolam (XANAX) 0.5 MG tablet Take 1 tablet (0.5 mg total) by mouth 2 (two) times daily as needed for anxiety.  Marland Kitchen buPROPion (WELLBUTRIN XL) 300 MG 24 hr tablet Take 1 tablet (300 mg total) by mouth daily.  . clotrimazole-betamethasone (LOTRISONE) cream Apply 1 application topically 2 (two) times daily.  . ergocalciferol (DRISDOL) 1.25 MG (50000 UT) capsule Take one cap q week  . Fe Fum-FA-B Cmp-C-Zn-Mg-Mn-Cu (HEMOCYTE-PLUS) 106-1 MG TABS Take one tab a day for iron def anemia  . levothyroxine (SYNTHROID) 50 MCG tablet Take 1 tablet (50 mcg total) by mouth daily.  . naltrexone (DEPADE) 50 MG tablet Take 50 mg by mouth daily.   No facility-administered encounter medications on file as of 07/08/2020.    Surgical History: Past Surgical History:  Procedure Laterality Date  . COLPOSCOPY  2010    Medical History: Past Medical History:  Diagnosis Date   . Anxiety     Family History: Family History  Problem Relation Age of Onset  . Hypertension Mother   . Hypercholesterolemia Mother   . Cancer Father   . COPD Father   . Hypertension Father   . Hypercholesterolemia Father   . Heart disease Paternal Grandfather   . Hypercholesterolemia Paternal Grandfather   . Hypertension Paternal Grandfather     Social History   Socioeconomic History  . Marital status: Single    Spouse name: Not on file  . Number of children: Not on file  . Years of education: Not on file  . Highest education level: Not on file  Occupational History  . Not on file  Tobacco Use  . Smoking status: Former Smoker    Types: Cigarettes    Quit date: 04/30/2020    Years since quitting: 0.1  . Smokeless tobacco: Never Used  Vaping Use  . Vaping Use: Never used  Substance and Sexual Activity  . Alcohol use: Yes    Comment: socially  . Drug use: No  . Sexual activity: Yes  Other Topics Concern  . Not on file  Social History Narrative  . Not on file   Social Determinants of Health   Financial Resource Strain: Not on file  Food Insecurity: Not on file  Transportation Needs: Not on file  Physical Activity: Not on file  Stress: Not on file  Social Connections: Not on file  Intimate Partner Violence: Not on file      Review  of Systems  Constitutional: Positive for fatigue. Negative for chills and unexpected weight change.  HENT: Negative for congestion, postnasal drip, rhinorrhea, sneezing and sore throat.   Eyes: Negative for redness.  Respiratory: Negative for cough, chest tightness and shortness of breath.   Cardiovascular: Negative for chest pain and palpitations.  Gastrointestinal: Negative for abdominal pain, constipation, diarrhea, nausea and vomiting.  Genitourinary: Negative for dysuria and frequency.  Musculoskeletal: Negative for arthralgias, back pain, joint swelling and neck pain.  Skin: Negative for rash.  Neurological: Negative.   Negative for tremors and numbness.  Hematological: Negative for adenopathy. Does not bruise/bleed easily.  Psychiatric/Behavioral: Negative for behavioral problems (Depression), sleep disturbance and suicidal ideas. The patient is nervous/anxious.     Vital Signs: BP 120/84   Pulse 96   Temp 97.9 F (36.6 C)   Resp 16   Ht 5\' 6"  (1.676 m)   Wt 188 lb 12.8 oz (85.6 kg)   SpO2 99%   BMI 30.47 kg/m    Physical Exam Vitals and nursing note reviewed.  Constitutional:      General: She is not in acute distress.    Appearance: She is well-developed. She is obese. She is not diaphoretic.  HENT:     Head: Normocephalic and atraumatic.     Mouth/Throat:     Pharynx: No oropharyngeal exudate.  Eyes:     Pupils: Pupils are equal, round, and reactive to light.  Neck:     Thyroid: No thyromegaly.     Vascular: No JVD.     Trachea: No tracheal deviation.  Cardiovascular:     Rate and Rhythm: Regular rhythm. Tachycardia present.     Heart sounds: Normal heart sounds. No murmur heard. No friction rub. No gallop.   Pulmonary:     Effort: Pulmonary effort is normal. No respiratory distress.     Breath sounds: No wheezing or rales.  Chest:     Chest wall: No tenderness.  Abdominal:     General: Bowel sounds are normal.     Palpations: Abdomen is soft.  Musculoskeletal:        General: Normal range of motion.     Cervical back: Normal range of motion and neck supple.  Lymphadenopathy:     Cervical: No cervical adenopathy.  Skin:    General: Skin is warm and dry.  Neurological:     Mental Status: She is alert and oriented to person, place, and time.     Cranial Nerves: No cranial nerve deficit.  Psychiatric:        Thought Content: Thought content normal.        Judgment: Judgment normal.     Comments: Anxious in room        Assessment/Plan: 1. Alcohol dependence with alcohol-induced anxiety disorder [facility]) Patient has increased daily alcohol intake and is interested in  cutting back/quitting but would like to restart naltrexone. Referral to psych placed and additional resources given for treatment centers: alcohol drug service and residential treatment service of [place] - Ambulatory referral to Psychiatry  2. Hypothyroidism, unspecified type TSH and free T4 in normal range, will check antibody. Repeat thyroid [facility] in 1 year - Thyroglobulin antibody  3. Tachycardia - EKG 12-Lead showed NSR, tachycardia likely secondary to anxiety coming in to office. Pt does not complain of palpitations, will monitor and consider BB if needed  4. GAD (generalized anxiety disorder) Stopped wellbutrin bc it made anxiety worse, able to stop smoking and has been stable. Will address further  with psych along with alcohol use - Ambulatory referral to Psychiatry   General Counseling: saya mccoll understanding of the findings of todays visit and agrees with plan of treatment. I have discussed any further diagnostic evaluation that may be needed or ordered today. We also reviewed her medications today. she has been encouraged to call the office with any questions or concerns that should arise related to todays visit.    Orders Placed This Encounter  Procedures  . Thyroglobulin antibody  . Ambulatory referral to Psychiatry  . EKG 12-Lead    No orders of the defined types were placed in this encounter.   This patient was seen by Lynn Ito, PA-C in collaboration with Dr. Beverely Risen as a part of collaborative care agreement.   Total time spent:40 Minutes Time spent includes review of chart, medications, test results, and follow up plan with the patient.      Dr Lyndon Code Internal medicine

## 2020-07-24 ENCOUNTER — Other Ambulatory Visit: Payer: Self-pay

## 2020-07-24 ENCOUNTER — Encounter: Payer: Self-pay | Admitting: Nurse Practitioner

## 2020-07-24 ENCOUNTER — Ambulatory Visit: Payer: Managed Care, Other (non HMO) | Admitting: Nurse Practitioner

## 2020-07-24 VITALS — BP 138/82 | HR 102 | Temp 98.4°F | Resp 16 | Ht 66.0 in | Wt 189.6 lb

## 2020-07-24 DIAGNOSIS — S70262A Insect bite (nonvenomous), left hip, initial encounter: Secondary | ICD-10-CM

## 2020-07-24 DIAGNOSIS — A6920 Lyme disease, unspecified: Secondary | ICD-10-CM | POA: Diagnosis not present

## 2020-07-24 DIAGNOSIS — W57XXXA Bitten or stung by nonvenomous insect and other nonvenomous arthropods, initial encounter: Secondary | ICD-10-CM

## 2020-07-24 MED ORDER — DOXYCYCLINE HYCLATE 100 MG PO TABS: 100.0000 mg | ORAL_TABLET | Freq: Two times a day (BID) | ORAL | 0 refills | Status: DC

## 2020-07-24 NOTE — Progress Notes (Signed)
[facility] [address] [place], [place] [address]  Internal MEDICINE  Office Visit Note  Patient Name: Wendy Sexton  [id number]  [id number]  Date of Service: 07/26/2020  Chief Complaint  Patient presents with  . Acute Visit    Tick bite on Saturday , location of bite Lt hip bone area, chills, bite is red and swollen, tingling          HPI  Wendy Sexton presents for an acute sick visit for a tick bite she found on Saturday.  The tick bite location is in the left hip area.  She reports a reddened area around the site that spreading and a hard knot where the bite is.  She does not have the tick that was attached so testing on the tick cannot be performed.  She has experienced low-grade fever, chills, tingling, headache, swollen lymph nodes in the groin, fatigue, and muscle aches.  Current Medication:  Outpatient Encounter Medications as of 07/24/2020  Medication Sig  . ALPRAZolam (XANAX) 0.5 MG tablet Take 1 tablet (0.5 mg total) by mouth 2 (two) times daily as needed for anxiety.  Marland Kitchen buPROPion (WELLBUTRIN XL) 300 MG 24 hr tablet Take 1 tablet (300 mg total) by mouth daily.  . clotrimazole-betamethasone (LOTRISONE) cream Apply 1 application topically 2 (two) times daily.  Marland Kitchen doxycycline (VIBRA-TABS) 100 MG tablet Take 1 tablet (100 mg total) by mouth 2 (two) times daily for 10 days.  . ergocalciferol (DRISDOL) 1.25 MG (50000 UT) capsule Take one cap q week  . Fe Fum-FA-B Cmp-C-Zn-Mg-Mn-Cu (HEMOCYTE-PLUS) 106-1 MG TABS Take one tab a day for iron def anemia  . levothyroxine (SYNTHROID) 50 MCG tablet Take 1 tablet (50 mcg total) by mouth daily.  . naltrexone (DEPADE) 50 MG tablet Take 50 mg by mouth daily.   No facility-administered encounter medications on file as of 07/24/2020.      Medical History: Past Medical History:  Diagnosis Date  . Anxiety      Vital Signs: BP 138/82   Pulse (!) 102   Temp 98.4 F (36.9 C)   Resp 16   Ht 5\' 6"  (1.676 m)   Wt 189 lb 9.6  oz (86 kg)   SpO2 98%   BMI 30.60 kg/m    Review of Systems  Constitutional: Positive for chills, fatigue and fever.  HENT: Negative.   Respiratory: Negative.  Negative for cough, chest tightness, shortness of breath and wheezing.   Cardiovascular: Negative for chest pain.  Gastrointestinal: Negative.   Genitourinary: Negative.   Musculoskeletal: Positive for arthralgias and myalgias.  Skin: Positive for rash.  Neurological: Positive for headaches.       Tingling around the tick bite site and on her left hip  Psychiatric/Behavioral: Negative.     Physical Exam Vitals reviewed.  Constitutional:      General: She is not in acute distress.    Appearance: Normal appearance. She is obese. She is not ill-appearing.  HENT:     Head: Normocephalic and atraumatic.  Cardiovascular:     Rate and Rhythm: Normal rate and regular rhythm.     Pulses: Normal pulses.     Heart sounds: Normal heart sounds.  Pulmonary:     Effort: Pulmonary effort is normal.     Breath sounds: Normal breath sounds.  Skin:    General: Skin is warm and dry.     Capillary Refill: Capillary refill takes less than 2 seconds.     Findings: Erythema and rash present. Rash is  macular.  Neurological:     Mental Status: She is alert and oriented to person, place, and time.  Psychiatric:        Mood and Affect: Mood normal.        Behavior: Behavior normal.   Assessment/Plan: 1. Tick bite of left hip, initial encounter Suspicious for lyme disease, labs ordered for next week to test for lyme. - Lyme Disease, IgM, Early Test w/ Rflx; Future - B. burgdorfi antibodies; Future  2. Early localized Lyme disease Clinical symptoms are consistent with possible Lyme disease infection., Doxycycline prescribed. Labs ordered to be done next week to test for Lyme - doxycycline (VIBRA-TABS) 100 MG tablet; Take 1 tablet (100 mg total) by mouth 2 (two) times daily for 10 days.  Dispense: 20 tablet; Refill: 0 - Lyme Disease,  IgM, Early Test w/ Rflx; Future - B. burgdorfi antibodies; Future   General Counseling: Wendy Sexton understanding of the findings of todays visit and agrees with plan of treatment. I have discussed any further diagnostic evaluation that may be needed or ordered today. We also reviewed her medications today. she has been encouraged to call the office with any questions or concerns that should arise related to todays visit.    Counseling:    Orders Placed This Encounter  Procedures  . Lyme Disease, IgM, Early Test w/ Rflx  . B. burgdorfi antibodies    Meds ordered this encounter  Medications  . doxycycline (VIBRA-TABS) 100 MG tablet    Sig: Take 1 tablet (100 mg total) by mouth 2 (two) times daily for 10 days.    Dispense:  20 tablet    Refill:  0   Return if symptoms worsen or fail to improve.  [place] Controlled Substance Database was reviewed by me.  Time spent: 30 Minutes This patient was seen by Sallyanne Kuster, FNP-C in collaboration with Dr. Beverely Risen as a part of collaborative care agreement.

## 2020-08-05 ENCOUNTER — Other Ambulatory Visit: Payer: Self-pay

## 2020-08-05 DIAGNOSIS — W57XXXA Bitten or stung by nonvenomous insect and other nonvenomous arthropods, initial encounter: Secondary | ICD-10-CM

## 2020-08-05 DIAGNOSIS — A6920 Lyme disease, unspecified: Secondary | ICD-10-CM

## 2020-08-05 DIAGNOSIS — S70262A Insect bite (nonvenomous), left hip, initial encounter: Secondary | ICD-10-CM

## 2020-08-15 ENCOUNTER — Encounter: Payer: Self-pay | Admitting: Nurse Practitioner

## 2020-08-20 LAB — LYME DISEASE, WESTERN BLOT
IgG P18 Ab.: ABSENT
IgG P23 Ab.: ABSENT
IgG P28 Ab.: ABSENT
IgG P30 Ab.: ABSENT
IgG P39 Ab.: ABSENT
IgG P41 Ab.: ABSENT
IgG P45 Ab.: ABSENT
IgG P58 Ab.: ABSENT
IgG P66 Ab.: ABSENT
IgG P93 Ab.: ABSENT
IgM P39 Ab.: ABSENT
IgM P41 Ab.: ABSENT
Lyme IgG Wb: NEGATIVE
Lyme IgM Wb: NEGATIVE

## 2020-08-20 LAB — LYME DISEASE SEROLOGY W/REFLEX: Lyme Total Antibody EIA: NEGATIVE

## 2020-08-21 NOTE — Progress Notes (Signed)
Yes you are right, its neg

## 2020-09-05 ENCOUNTER — Telehealth: Payer: Self-pay

## 2020-09-05 NOTE — Telephone Encounter (Signed)
Called patient to give her results of her lyme diease test were negative will repeat labs in a few months, patient did not answer.LNB

## 2020-09-09 ENCOUNTER — Telehealth: Payer: Self-pay

## 2020-10-16 ENCOUNTER — Other Ambulatory Visit: Payer: Self-pay | Admitting: Nurse Practitioner

## 2020-10-16 DIAGNOSIS — Z1231 Encounter for screening mammogram for malignant neoplasm of breast: Secondary | ICD-10-CM

## 2020-10-16 NOTE — Telephone Encounter (Signed)
LMOM to let pt know the order for her mammogram is in epic and she can schedule her appt with Delford Field

## 2020-11-07 ENCOUNTER — Ambulatory Visit: Payer: Managed Care, Other (non HMO) | Admitting: Physician Assistant

## 2021-02-27 ENCOUNTER — Other Ambulatory Visit: Payer: Self-pay | Admitting: Physician Assistant

## 2021-02-27 ENCOUNTER — Telehealth: Payer: Self-pay

## 2021-02-27 DIAGNOSIS — E538 Deficiency of other specified B group vitamins: Secondary | ICD-10-CM

## 2021-02-27 DIAGNOSIS — E782 Mixed hyperlipidemia: Secondary | ICD-10-CM

## 2021-02-27 DIAGNOSIS — E559 Vitamin D deficiency, unspecified: Secondary | ICD-10-CM

## 2021-02-27 DIAGNOSIS — D500 Iron deficiency anemia secondary to blood loss (chronic): Secondary | ICD-10-CM

## 2021-02-27 DIAGNOSIS — R5383 Other fatigue: Secondary | ICD-10-CM

## 2021-02-27 DIAGNOSIS — Z1231 Encounter for screening mammogram for malignant neoplasm of breast: Secondary | ICD-10-CM

## 2021-02-27 DIAGNOSIS — E039 Hypothyroidism, unspecified: Secondary | ICD-10-CM

## 2021-02-27 NOTE — Telephone Encounter (Signed)
Notified patient to fast for lab work and mammogram order has been placed so she can call to schedule.

## 2021-03-03 ENCOUNTER — Other Ambulatory Visit: Payer: Self-pay | Admitting: Internal Medicine

## 2021-03-03 DIAGNOSIS — E039 Hypothyroidism, unspecified: Secondary | ICD-10-CM

## 2021-03-21 NOTE — Telephone Encounter (Signed)
Error

## 2021-03-27 ENCOUNTER — Other Ambulatory Visit: Payer: Self-pay | Admitting: Physician Assistant

## 2021-03-27 DIAGNOSIS — E039 Hypothyroidism, unspecified: Secondary | ICD-10-CM

## 2021-03-27 NOTE — Telephone Encounter (Signed)
Pt need appt for refills  ?

## 2021-03-31 ENCOUNTER — Telehealth: Payer: Self-pay

## 2021-03-31 NOTE — Telephone Encounter (Signed)
Left vm to confirm 04/03/21 appointment-Toni °

## 2021-04-03 ENCOUNTER — Other Ambulatory Visit: Payer: BC Managed Care – PPO | Admitting: Physician Assistant

## 2021-04-08 ENCOUNTER — Ambulatory Visit
Admission: RE | Admit: 2021-04-08 | Discharge: 2021-04-08 | Disposition: A | Discharge status: Home / Self Care | Payer: BC Managed Care – PPO | Source: Ambulatory Visit | Attending: Physician Assistant | Admitting: Physician Assistant

## 2021-04-08 ENCOUNTER — Other Ambulatory Visit: Payer: BC Managed Care – PPO | Admitting: Nurse Practitioner

## 2021-04-08 ENCOUNTER — Other Ambulatory Visit: Payer: Self-pay

## 2021-04-08 DIAGNOSIS — Z1231 Encounter for screening mammogram for malignant neoplasm of breast: Secondary | ICD-10-CM | POA: Diagnosis not present

## 2021-04-08 DIAGNOSIS — R922 Inconclusive mammogram: Secondary | ICD-10-CM | POA: Diagnosis not present

## 2021-04-30 ENCOUNTER — Other Ambulatory Visit: Payer: Self-pay | Admitting: Physician Assistant

## 2021-04-30 DIAGNOSIS — E039 Hypothyroidism, unspecified: Secondary | ICD-10-CM

## 2021-05-13 ENCOUNTER — Telehealth: Payer: Self-pay

## 2021-05-13 ENCOUNTER — Other Ambulatory Visit: Payer: Self-pay | Admitting: Physician Assistant

## 2021-05-13 DIAGNOSIS — E039 Hypothyroidism, unspecified: Secondary | ICD-10-CM

## 2021-05-13 NOTE — Telephone Encounter (Signed)
Left vm and sent mychart message to confirm 05/16/21 appointment-Toni ?

## 2021-05-14 NOTE — Telephone Encounter (Signed)
Only for 30 days pt need appt for refill  ?

## 2021-05-16 ENCOUNTER — Encounter: Payer: Self-pay | Admitting: Physician Assistant

## 2021-05-16 ENCOUNTER — Ambulatory Visit (INDEPENDENT_AMBULATORY_CARE_PROVIDER_SITE_OTHER): Payer: BC Managed Care – PPO | Admitting: Physician Assistant

## 2021-05-16 ENCOUNTER — Other Ambulatory Visit: Payer: Self-pay

## 2021-05-16 DIAGNOSIS — E039 Hypothyroidism, unspecified: Secondary | ICD-10-CM | POA: Diagnosis not present

## 2021-05-16 DIAGNOSIS — E041 Nontoxic single thyroid nodule: Secondary | ICD-10-CM

## 2021-05-16 DIAGNOSIS — R300 Dysuria: Secondary | ICD-10-CM

## 2021-05-16 DIAGNOSIS — L309 Dermatitis, unspecified: Secondary | ICD-10-CM | POA: Diagnosis not present

## 2021-05-16 DIAGNOSIS — Z0001 Encounter for general adult medical examination with abnormal findings: Secondary | ICD-10-CM

## 2021-05-16 DIAGNOSIS — Z1211 Encounter for screening for malignant neoplasm of colon: Secondary | ICD-10-CM

## 2021-05-16 DIAGNOSIS — Z1212 Encounter for screening for malignant neoplasm of rectum: Secondary | ICD-10-CM

## 2021-05-16 DIAGNOSIS — F17218 Nicotine dependence, cigarettes, with other nicotine-induced disorders: Secondary | ICD-10-CM

## 2021-05-16 MED ORDER — VARENICLINE TARTRATE 0.5 MG X 11 & 1 MG X 42 PO TBPK: ORAL_TABLET | ORAL | 0 refills | Status: DC

## 2021-05-16 MED ORDER — TRIAMCINOLONE ACETONIDE 0.1 % EX CREA: 1.0000 "application " | TOPICAL_CREAM | Freq: Two times a day (BID) | CUTANEOUS | 0 refills | Status: DC

## 2021-05-16 NOTE — Progress Notes (Signed)
?[facility] ?[address] ?[place], [place] [address] ? ?Internal MEDICINE  ?Office Visit Note ? ?Patient Name: Wendy Sexton ? [id number]  ?[id number] ? ?Date of Service: 05/16/2021 ? ?Chief Complaint  ?Patient presents with  ? Annual Exam  ? Anxiety  ? Quality Metric Gaps  ?  Colonoscopy - prefers cologuard  ? ? ? ?HPI ?Pt is here for routine health maintenance examination ?-Sleeping well ?-Not currently doing any exercise, hoping to start soon as weather improves ?-anxiety stable, not taking any xanax currently ?-she is due for routine labs ordered in December, states she will need thyroid refill in a few weeks but will try to have labs done first in case potential dosing change is indicated. 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 Pt agreeable to this. ?-quit smoking for 33months but started back last fall. Requests to start back on chantix as she did well with this before in hopes of quitting. ?-ezcema flare currently on back of her hands and requests cream to help calm this down ?-Up to date on pap done 2 years ago and was normal, mammogram up to date ?-No Fhx of colorectal cancer and not having any symptoms therefore would like to do cologuard instead of colonoscopy ? ?Current Medication: ?Outpatient Encounter Medications as of 05/16/2021  ?Medication Sig  ? ALPRAZolam (XANAX) 0.5 MG tablet Take 1 tablet (0.5 mg total) by mouth 2 (two) times daily as needed for anxiety.  ? clotrimazole-betamethasone (LOTRISONE) cream Apply 1 application topically 2 (two) times daily.  ? ergocalciferol (DRISDOL) 1.25 MG (50000 UT) capsule Take one cap q week  ? Fe Fum-FA-B Cmp-C-Zn-Mg-Mn-Cu (HEMOCYTE-PLUS) 106-1 MG TABS Take one tab a day for iron def anemia  ? levothyroxine (SYNTHROID) 50 MCG tablet TAKE 1 TABLET BY MOUTH EVERY DAY  ?  triamcinolone cream (KENALOG) 0.1 % Apply 1 application. topically 2 (two) times daily.  ? varenicline (CHANTIX PAK) 0.5 MG X 11 & 1 MG X 42 tablet Take one 0.5 mg tablet by mouth once daily for 3 days, then increase to one 0.5 mg tablet twice daily for 4 days, then increase to one 1 mg tablet twice daily.  ? doxycycline (VIBRA-TABS) 100 MG tablet Take 1 tablet (100 mg total) by mouth 2 (two) times daily for 10 days.  ? [DISCONTINUED] buPROPion (WELLBUTRIN XL) 300 MG 24 hr tablet Take 1 tablet (300 mg total) by mouth daily. (Patient not taking: Reported on 05/16/2021)  ? [DISCONTINUED] naltrexone (DEPADE) 50 MG tablet Take 50 mg by mouth daily. (Patient not taking: Reported on 05/16/2021)  ? ?No facility-administered encounter medications on file as of 05/16/2021.  ? ? ?Surgical History: ?Past Surgical History:  ?Procedure Laterality Date  ? COLPOSCOPY  2010  ? ? ?Medical History: ?Past Medical History:  ?Diagnosis Date  ? Anxiety   ? ? ?Family History: ?Family History  ?Problem Relation Age of Onset  ? Hypertension Mother   ? Hypercholesterolemia Mother   ? Cancer Father   ? COPD Father   ? Hypertension Father   ? Hypercholesterolemia Father   ? Heart disease Paternal Grandfather   ? Hypercholesterolemia Paternal Grandfather   ? Hypertension Paternal Grandfather   ? ? ? ? ?Review of Systems  ?Constitutional:  Negative for chills, fatigue and unexpected weight change.  ?HENT:  Negative for congestion, postnasal drip, rhinorrhea, sneezing and sore throat.   ?Eyes:  Negative for redness.  ?Respiratory:  Negative for cough, chest tightness and shortness of breath.   ?Cardiovascular:  Negative for chest pain and palpitations.  ?Gastrointestinal:  Negative for abdominal pain, constipation, diarrhea, nausea and vomiting.  ?Genitourinary:  Negative for dysuria and frequency.  ?Musculoskeletal:  Negative for arthralgias, back pain, joint swelling and neck pain.  ?Skin:  Positive for rash.  ?Neurological: Negative.  Negative  for tremors and numbness.  ?Hematological:  Negative for adenopathy. Does not bruise/bleed easily.  ?Psychiatric/Behavioral:  Negative for behavioral problems (Depression), sleep disturbance and suicidal ideas. The patient is not nervous/anxious.   ? ? ?Vital Signs: ?BP 136/88   Pulse (!) 104   Temp 97.7 ?F (36.5 ?C)   Resp 16   Ht 5\' 6"  (1.676 m)   Wt 194 lb 12.8 oz (88.4 kg)   SpO2 97%   BMI 31.44 kg/m?  ? ? ?Physical Exam ?Vitals and nursing note reviewed.  ?Constitutional:   ?   General: She is not in acute distress. ?   Appearance: She is well-developed. She is obese. She is not diaphoretic.  ?HENT:  ?   Head: Normocephalic and atraumatic.  ?   Mouth/Throat:  ?   Pharynx: No oropharyngeal exudate.  ?Eyes:  ?   Pupils: Pupils are equal, round, and reactive to light.  ?Neck:  ?   Thyroid: No thyromegaly.  ?   Vascular: No JVD.  ?   Trachea: No tracheal deviation.  ?Cardiovascular:  ?   Rate and Rhythm: Normal rate and regular rhythm.  ?   Heart sounds: Normal heart sounds. No murmur heard. ?  No friction rub. No gallop.  ?Pulmonary:  ?   Effort: Pulmonary effort is normal. No respiratory distress.  ?   Breath sounds: No wheezing or rales.  ?Chest:  ?   Chest wall: No tenderness.  ?Breasts: ?   Right: Normal. No mass.  ?   Left: Normal. No mass.  ?Abdominal:  ?   General: Bowel sounds are normal.  ?   Palpations: Abdomen is soft.  ?   Tenderness: There is no abdominal tenderness.  ?Musculoskeletal:     ?   General: Normal range of motion.  ?   Cervical back: Normal range of motion and neck supple.  ?Lymphadenopathy:  ?   Cervical: No cervical adenopathy.  ?Skin: ?   General: Skin is warm and dry.  ?   Findings: Rash present.  ?   Comments: Eczema on back of both hands  ?Neurological:  ?   Mental Status: She is alert and oriented to person, place, and time.  ?   Cranial Nerves: No cranial nerve deficit.  ?Psychiatric:     ?   Behavior: Behavior normal.     ?   Thought Content: Thought content normal.     ?    Judgment: Judgment normal.  ? ? ? ?LABS: ?No results found for this or any previous visit (from the past 2160 hour(s)). ? ? ? ? ? ?Assessment/Plan: ?1. Encounter for general adult medical examination with abnormal findings ?CPE performed, patient will go for routine fasting labs previously ordered.  Up-to-date on Pap and mammogram and will have Cologuard for colon cancer screening ? ?2. Hypothyroidism, unspecified type ?Continue on current dose of Synthroid while awaiting updated labs and will adjust dose as indicated.  We will also update thyroid ultrasound due to small nodule and  colloid cyst seen 1 year ago ?- US THYROID; Future ? ?3. Eczema of both hands ?May use Kenalog cream as needed for eczema flare ?- triamcinolone cream (KENALOG) 0.1 %; Apply 1 application. topically 2 (two) times daily.  Dispense: 30 g; Refill: 0 ? ?4. Cigarette nicotine dependence with other nicotine-induced disorder ?May start on Chantix while working on smoking cessation ?Smoking cessation counseling: ?Pt acknowledges the risks of long term smoking, she will try to quite smoking. ?Options for different medications including nicotine products, chewing gum, patch etc, Wellbutrin and Chantix is discussed ?Goal and date of compete cessation is discussed ?Total time spent in smoking cessation is 10 min.  ?- varenicline (CHANTIX PAK) 0.5 MG X 11 & 1 MG X 42 tablet; Take one 0.5 mg tablet by mouth once daily for 3 days, then increase to one 0.5 mg tablet twice daily for 4 days, then increase to one 1 mg tablet twice daily.  Dispense: 53 tablet; Refill: 0 ? ?5. Screening for colorectal cancer ?- Cologuard ? ?6. Dysuria ?- UA/M w/rflx Culture, Routine ? ?7.  Thyroid nodule ?We will update thyroid ultrasound for further monitoring ?- US THYROID; Future ? ?General Counseling: tenleigh orquiz understanding of the findings of todays visit and agrees with plan of treatment. I have discussed any further diagnostic evaluation that may be needed or  ordered today. We also reviewed her medications today. she has been encouraged to call the office with any questions or concerns that should arise related to todays visit. ? ? ? ?Counseling: ? ? ? ?Orders Pla

## 2021-05-25 LAB — UA/M W/RFLX CULTURE, ROUTINE

## 2021-06-24 ENCOUNTER — Telehealth: Payer: Self-pay

## 2021-06-24 DIAGNOSIS — J069 Acute upper respiratory infection, unspecified: Secondary | ICD-10-CM | POA: Diagnosis not present

## 2021-06-24 DIAGNOSIS — Z20822 Contact with and (suspected) exposure to covid-19: Secondary | ICD-10-CM | POA: Diagnosis not present

## 2021-06-24 NOTE — Telephone Encounter (Signed)
Left vm to confirm 06/30/21 appointment-Toni ?

## 2021-06-30 ENCOUNTER — Ambulatory Visit: Payer: BC Managed Care – PPO

## 2021-06-30 DIAGNOSIS — E039 Hypothyroidism, unspecified: Secondary | ICD-10-CM | POA: Diagnosis not present

## 2021-06-30 DIAGNOSIS — E041 Nontoxic single thyroid nodule: Secondary | ICD-10-CM | POA: Diagnosis not present

## 2021-07-02 ENCOUNTER — Other Ambulatory Visit: Payer: BC Managed Care – PPO

## 2021-07-17 ENCOUNTER — Ambulatory Visit: Payer: BC Managed Care – PPO | Admitting: Physician Assistant

## 2021-07-17 ENCOUNTER — Other Ambulatory Visit: Payer: Self-pay | Admitting: Physician Assistant

## 2021-07-17 DIAGNOSIS — E039 Hypothyroidism, unspecified: Secondary | ICD-10-CM

## 2021-08-01 ENCOUNTER — Ambulatory Visit: Payer: BC Managed Care – PPO | Admitting: Physician Assistant

## 2021-08-06 DIAGNOSIS — E538 Deficiency of other specified B group vitamins: Secondary | ICD-10-CM | POA: Diagnosis not present

## 2021-08-06 DIAGNOSIS — R5383 Other fatigue: Secondary | ICD-10-CM | POA: Diagnosis not present

## 2021-08-06 DIAGNOSIS — E782 Mixed hyperlipidemia: Secondary | ICD-10-CM | POA: Diagnosis not present

## 2021-08-06 DIAGNOSIS — E039 Hypothyroidism, unspecified: Secondary | ICD-10-CM | POA: Diagnosis not present

## 2021-08-06 DIAGNOSIS — E559 Vitamin D deficiency, unspecified: Secondary | ICD-10-CM | POA: Diagnosis not present

## 2021-08-06 DIAGNOSIS — D500 Iron deficiency anemia secondary to blood loss (chronic): Secondary | ICD-10-CM | POA: Diagnosis not present

## 2021-08-07 ENCOUNTER — Ambulatory Visit (INDEPENDENT_AMBULATORY_CARE_PROVIDER_SITE_OTHER): Payer: BC Managed Care – PPO | Admitting: Physician Assistant

## 2021-08-07 ENCOUNTER — Encounter: Payer: Self-pay | Admitting: Physician Assistant

## 2021-08-07 VITALS — BP 133/78 | HR 95 | Temp 97.8°F | Resp 16 | Ht 66.0 in | Wt 192.4 lb

## 2021-08-07 DIAGNOSIS — E041 Nontoxic single thyroid nodule: Secondary | ICD-10-CM | POA: Diagnosis not present

## 2021-08-07 DIAGNOSIS — D500 Iron deficiency anemia secondary to blood loss (chronic): Secondary | ICD-10-CM

## 2021-08-07 DIAGNOSIS — E782 Mixed hyperlipidemia: Secondary | ICD-10-CM

## 2021-08-07 DIAGNOSIS — E039 Hypothyroidism, unspecified: Secondary | ICD-10-CM

## 2021-08-07 DIAGNOSIS — L309 Dermatitis, unspecified: Secondary | ICD-10-CM

## 2021-08-07 MED ORDER — TRIAMCINOLONE ACETONIDE 0.1 % EX CREA: 1.0000 "application " | TOPICAL_CREAM | Freq: Two times a day (BID) | CUTANEOUS | 0 refills | Status: DC

## 2021-08-07 MED ORDER — ROSUVASTATIN CALCIUM 5 MG PO TABS: 5.0000 mg | ORAL_TABLET | Freq: Every day | ORAL | 3 refills | Status: DC

## 2021-08-07 MED ORDER — HEMOCYTE-PLUS 106-1 MG PO TABS: ORAL_TABLET | ORAL | 3 refills | Status: DC

## 2021-08-07 NOTE — Progress Notes (Cosign Needed)
[facility] [address] [place], [place] [address]  Internal MEDICINE  Office Visit Note  Patient Name: Wendy NapCarrie Helmers  [id number] 25, 1977  [id number]  Date of Service: 08/19/2021  Chief Complaint  Patient presents with   Follow-up    U/s    Anxiety    HPI Pt is here follow up to review labs and US -Thyroid US showed subcentimeter nodule in both lobes and no further follow up was recommended -Still needs to do cologuard -Tried chantix and stopped but thinks she is going to restart -Reviewed labs and showed very low iron and will start back daily supplement, low vitamin D, borderline B12--did b12 shots previously and had side effect therefore will try oral supplement and avoid injection especially since only borderline -thyroid antibodies normal and thyroid in range on current dose of synthroid -Cholesterol is still elevated and the same from last year. Will start on crestor -Had a cold about a month ago  Current Medication: Outpatient Encounter Medications as of 08/07/2021  Medication Sig   ALPRAZolam (XANAX) 0.5 MG tablet Take 1 tablet (0.5 mg total) by mouth 2 (two) times daily as needed for anxiety.   clotrimazole-betamethasone (LOTRISONE) cream Apply 1 application topically 2 (two) times daily.   ergocalciferol (DRISDOL) 1.25 MG (50000 UT) capsule Take one cap q week   levothyroxine (SYNTHROID) 50 MCG tablet TAKE 1 TABLET BY MOUTH EVERY DAY   rosuvastatin (CRESTOR) 5 MG tablet Take 1 tablet (5 mg total) by mouth daily.   varenicline (CHANTIX PAK) 0.5 MG X 11 & 1 MG X 42 tablet Take one 0.5 mg tablet by mouth once daily for 3 days, then increase to one 0.5 mg tablet twice daily for 4 days, then increase to one 1 mg tablet twice daily.   [DISCONTINUED] Fe Fum-FA-B Cmp-C-Zn-Mg-Mn-Cu (HEMOCYTE-PLUS) 106-1 MG TABS Take one tab a day for iron def anemia   [DISCONTINUED] triamcinolone cream (KENALOG) 0.1 % Apply 1 application. topically 2 (two) times daily.   doxycycline  (VIBRA-TABS) 100 MG tablet Take 1 tablet (100 mg total) by mouth 2 (two) times daily for 10 days.   Fe Fum-FA-B Cmp-C-Zn-Mg-Mn-Cu (HEMOCYTE-PLUS) 106-1 MG TABS Take one tab a day for iron def anemia   triamcinolone cream (KENALOG) 0.1 % Apply 1 application. topically 2 (two) times daily.   No facility-administered encounter medications on file as of 08/07/2021.    Surgical History: Past Surgical History:  Procedure Laterality Date   COLPOSCOPY  2010    Medical History: Past Medical History:  Diagnosis Date   Anxiety     Family History: Family History  Problem Relation Age of Onset   Hypertension Mother    Hypercholesterolemia Mother    Cancer Father    COPD Father    Hypertension Father    Hypercholesterolemia Father    Heart disease Paternal Grandfather    Hypercholesterolemia Paternal Grandfather    Hypertension Paternal Grandfather     Social History   Socioeconomic History   Marital status: Single    Spouse name: Not on file   Number of children: Not on file   Years of education: Not on file   Highest education level: Not on file  Occupational History   Not on file  Tobacco Use   Smoking status: Every Day    Types: Cigarettes    Last attempt to quit: 04/30/2020    Years since quitting: 1.3   Smokeless tobacco: Never   Tobacco comments:    1 pack daily  Vaping  Use   Vaping Use: Never used  Substance and Sexual Activity   Alcohol use: Yes    Comment: socially   Drug use: No   Sexual activity: Yes  Other Topics Concern   Not on file  Social History Narrative   Not on file   Social Determinants of Health   Financial Resource Strain: Not on file  Food Insecurity: Not on file  Transportation Needs: Not on file  Physical Activity: Not on file  Stress: Not on file  Social Connections: Not on file  Intimate Partner Violence: Not on file      Review of Systems  Constitutional:  Negative for chills, fatigue and unexpected weight change.  HENT:   Negative for congestion, postnasal drip, rhinorrhea, sneezing and sore throat.   Eyes:  Negative for redness.  Respiratory:  Negative for cough, chest tightness and shortness of breath.   Cardiovascular:  Negative for chest pain and palpitations.  Gastrointestinal:  Negative for abdominal pain, constipation, diarrhea, nausea and vomiting.  Genitourinary:  Negative for dysuria and frequency.  Musculoskeletal:  Negative for arthralgias, back pain, joint swelling and neck pain.  Skin:  Negative for rash.  Neurological: Negative.  Negative for tremors and numbness.  Hematological:  Negative for adenopathy. Does not bruise/bleed easily.  Psychiatric/Behavioral:  Negative for behavioral problems (Depression), sleep disturbance and suicidal ideas. The patient is not nervous/anxious.     Vital Signs: BP 133/78   Pulse 95   Temp 97.8 F (36.6 C)   Resp 16   Ht 5\' 6"  (1.676 m)   Wt 192 lb 6.4 oz (87.3 kg)   SpO2 97%   BMI 31.05 kg/m    Physical Exam Vitals and nursing note reviewed.  Constitutional:      General: She is not in acute distress.    Appearance: Normal appearance. She is well-developed. She is not diaphoretic.  HENT:     Head: Normocephalic and atraumatic.     Mouth/Throat:     Pharynx: No oropharyngeal exudate.  Eyes:     Pupils: Pupils are equal, round, and reactive to light.  Neck:     Thyroid: No thyromegaly.     Vascular: No JVD.     Trachea: No tracheal deviation.  Cardiovascular:     Rate and Rhythm: Normal rate and regular rhythm.     Heart sounds: Normal heart sounds. No murmur heard.    No friction rub. No gallop.  Pulmonary:     Effort: Pulmonary effort is normal. No respiratory distress.     Breath sounds: No wheezing or rales.  Chest:     Chest wall: No tenderness.  Abdominal:     General: Bowel sounds are normal.     Palpations: Abdomen is soft.  Musculoskeletal:        General: Normal range of motion.     Cervical back: Normal range of motion  and neck supple.  Lymphadenopathy:     Cervical: No cervical adenopathy.  Skin:    General: Skin is warm and dry.  Neurological:     Mental Status: She is alert and oriented to person, place, and time.     Cranial Nerves: No cranial nerve deficit.  Psychiatric:        Behavior: Behavior normal.        Thought Content: Thought content normal.        Judgment: Judgment normal.        Assessment/Plan: 1. Mixed hyperlipidemia Will start on crestor and continue  to work on diet and exercise - rosuvastatin (CRESTOR) 5 MG tablet; Take 1 tablet (5 mg total) by mouth daily.  Dispense: 90 tablet; Refill: 3  2. Iron deficiency anemia due to chronic blood loss Very low and will start back on daily supplement - Fe Fum-FA-B Cmp-C-Zn-Mg-Mn-Cu (HEMOCYTE-PLUS) 106-1 MG TABS; Take one tab a day for iron def anemia  Dispense: 30 tablet; Refill: 3  3. Hypothyroidism, unspecified type Stable, continue current dose of synthroid  4. Thyroid nodule Sub-centimeter nodules seen on [place] again, no further follow up recommended though may recheck [place] in future as they did get slightly bigger  5. Eczema of both hands - triamcinolone cream (KENALOG) 0.1 %; Apply 1 application. topically 2 (two) times daily.  Dispense: 30 g; Refill: 0   General Counseling: Russell verbalizes understanding of the findings of todays visit and agrees with plan of treatment. I have discussed any further diagnostic evaluation that may be needed or ordered today. We also reviewed her medications today. she has been encouraged to call the office with any questions or concerns that should arise related to todays visit.    No orders of the defined types were placed in this encounter.   Meds ordered this encounter  Medications   rosuvastatin (CRESTOR) 5 MG tablet    Sig: Take 1 tablet (5 mg total) by mouth daily.    Dispense:  90 tablet    Refill:  3   triamcinolone cream (KENALOG) 0.1 %    Sig: Apply 1 application. topically 2  (two) times daily.    Dispense:  30 g    Refill:  0   Fe Fum-FA-B Cmp-C-Zn-Mg-Mn-Cu (HEMOCYTE-PLUS) 106-1 MG TABS    Sig: Take one tab a day for iron def anemia    Dispense:  30 tablet    Refill:  3    This patient was seen by Lynn Ito, PA-C in collaboration with Dr. Beverely Risen as a part of collaborative care agreement.   Total time spent:30 Minutes Time spent includes review of chart, medications, test results, and follow up plan with the patient.      Dr Lyndon Code Internal medicine

## 2021-08-08 LAB — CBC WITH DIFFERENTIAL/PLATELET
Basophils Absolute: 0.1 10*3/uL (ref 0.0–0.2)
Basos: 1 %
EOS (ABSOLUTE): 0.5 10*3/uL — ABNORMAL HIGH (ref 0.0–0.4)
Eos: 4 %
Hematocrit: 34.4 % (ref 34.0–46.6)
Hemoglobin: 11.1 g/dL (ref 11.1–15.9)
Immature Grans (Abs): 0 10*3/uL (ref 0.0–0.1)
Immature Granulocytes: 0 %
Lymphocytes Absolute: 2 10*3/uL (ref 0.7–3.1)
Lymphs: 17 %
MCH: 23.9 pg — ABNORMAL LOW (ref 26.6–33.0)
MCHC: 32.3 g/dL (ref 31.5–35.7)
MCV: 74 fL — ABNORMAL LOW (ref 79–97)
Monocytes Absolute: 1.1 10*3/uL — ABNORMAL HIGH (ref 0.1–0.9)
Monocytes: 10 %
Neutrophils Absolute: 7.8 10*3/uL — ABNORMAL HIGH (ref 1.4–7.0)
Neutrophils: 68 %
Platelets: 426 10*3/uL (ref 150–450)
RBC: 4.65 x10E6/uL (ref 3.77–5.28)
RDW: 15.1 % (ref 11.7–15.4)
WBC: 11.5 10*3/uL — ABNORMAL HIGH (ref 3.4–10.8)

## 2021-08-08 LAB — COMPREHENSIVE METABOLIC PANEL
ALT: 32 IU/L (ref 0–32)
AST: 28 IU/L (ref 0–40)
Albumin/Globulin Ratio: 1.9 (ref 1.2–2.2)
Albumin: 4.7 g/dL (ref 3.8–4.8)
Alkaline Phosphatase: 90 IU/L (ref 44–121)
BUN/Creatinine Ratio: 16 (ref 9–23)
BUN: 12 mg/dL (ref 6–24)
Bilirubin Total: <0.2 mg/dL (ref 0.0–1.2)
CO2: 19 mmol/L — ABNORMAL LOW (ref 20–29)
Calcium: 9.5 mg/dL (ref 8.7–10.2)
Chloride: 104 mmol/L (ref 96–106)
Creatinine, Ser: 0.74 mg/dL (ref 0.57–1.00)
Globulin, Total: 2.5 g/dL (ref 1.5–4.5)
Glucose: 91 mg/dL (ref 70–99)
Potassium: 4.6 mmol/L (ref 3.5–5.2)
Sodium: 137 mmol/L (ref 134–144)
Total Protein: 7.2 g/dL (ref 6.0–8.5)
eGFR: 101 mL/min/{1.73_m2} (ref >59)

## 2021-08-08 LAB — LIPID PANEL WITH LDL/HDL RATIO
Cholesterol, Total: 246 mg/dL — ABNORMAL HIGH (ref 100–199)
HDL: 48 mg/dL (ref >39)
LDL Chol Calc (NIH): 143 mg/dL — ABNORMAL HIGH (ref 0–99)
LDL/HDL Ratio: 3 ratio (ref 0.0–3.2)
Triglycerides: 305 mg/dL — ABNORMAL HIGH (ref 0–149)
VLDL Cholesterol Cal: 55 mg/dL — ABNORMAL HIGH (ref 5–40)

## 2021-08-08 LAB — THYROID PEROXIDASE ANTIBODY: Thyroperoxidase Ab SerPl-aCnc: <9 IU/mL (ref 0–34)

## 2021-08-08 LAB — IRON,TIBC AND FERRITIN PANEL
Ferritin: 9 ng/mL — ABNORMAL LOW (ref 15–150)
Iron Saturation: 3 % — CL (ref 15–55)
Iron: 14 ug/dL — ABNORMAL LOW (ref 27–159)
Total Iron Binding Capacity: 477 ug/dL — ABNORMAL HIGH (ref 250–450)
UIBC: 463 ug/dL — ABNORMAL HIGH (ref 131–425)

## 2021-08-08 LAB — VITAMIN D 25 HYDROXY (VIT D DEFICIENCY, FRACTURES): Vit D, 25-Hydroxy: 27 ng/mL — ABNORMAL LOW (ref 30.0–100.0)

## 2021-08-08 LAB — B12 AND FOLATE PANEL
Folate: 11.5 ng/mL (ref >3.0)
Vitamin B-12: 344 pg/mL (ref 232–1245)

## 2021-08-08 LAB — TSH+FREE T4
Free T4: 0.95 ng/dL (ref 0.82–1.77)
TSH: 3.8 u[IU]/mL (ref 0.450–4.500)

## 2021-08-08 LAB — THYROGLOBULIN ANTIBODY: Thyroglobulin Antibody: <1 IU/mL (ref 0.0–0.9)

## 2021-12-08 ENCOUNTER — Ambulatory Visit: Payer: BC Managed Care – PPO | Admitting: Physician Assistant

## 2022-05-14 ENCOUNTER — Telehealth: Payer: Self-pay | Admitting: Physician Assistant

## 2022-05-14 NOTE — Telephone Encounter (Signed)
Left vm and sent mychart message to confirm 05/21/22 appointment-Toni

## 2022-05-21 ENCOUNTER — Ambulatory Visit (INDEPENDENT_AMBULATORY_CARE_PROVIDER_SITE_OTHER): Payer: BC Managed Care – PPO | Admitting: Physician Assistant

## 2022-05-21 ENCOUNTER — Encounter: Payer: Self-pay | Admitting: Physician Assistant

## 2022-05-21 VITALS — BP 130/80 | HR 97 | Temp 98.0°F | Resp 16 | Ht 66.0 in | Wt 183.0 lb

## 2022-05-21 DIAGNOSIS — D500 Iron deficiency anemia secondary to blood loss (chronic): Secondary | ICD-10-CM | POA: Diagnosis not present

## 2022-05-21 DIAGNOSIS — Z0001 Encounter for general adult medical examination with abnormal findings: Secondary | ICD-10-CM

## 2022-05-21 DIAGNOSIS — Z1231 Encounter for screening mammogram for malignant neoplasm of breast: Secondary | ICD-10-CM

## 2022-05-21 DIAGNOSIS — E782 Mixed hyperlipidemia: Secondary | ICD-10-CM

## 2022-05-21 DIAGNOSIS — R300 Dysuria: Secondary | ICD-10-CM

## 2022-05-21 DIAGNOSIS — F17218 Nicotine dependence, cigarettes, with other nicotine-induced disorders: Secondary | ICD-10-CM | POA: Diagnosis not present

## 2022-05-21 DIAGNOSIS — E559 Vitamin D deficiency, unspecified: Secondary | ICD-10-CM

## 2022-05-21 DIAGNOSIS — E039 Hypothyroidism, unspecified: Secondary | ICD-10-CM | POA: Diagnosis not present

## 2022-05-21 DIAGNOSIS — R5383 Other fatigue: Secondary | ICD-10-CM

## 2022-05-21 DIAGNOSIS — Z1211 Encounter for screening for malignant neoplasm of colon: Secondary | ICD-10-CM

## 2022-05-21 DIAGNOSIS — Z1212 Encounter for screening for malignant neoplasm of rectum: Secondary | ICD-10-CM

## 2022-05-21 DIAGNOSIS — E538 Deficiency of other specified B group vitamins: Secondary | ICD-10-CM

## 2022-05-21 MED ORDER — VARENICLINE TARTRATE (STARTER) 0.5 MG X 11 & 1 MG X 42 PO TBPK: ORAL_TABLET | ORAL | 0 refills | Status: AC

## 2022-05-21 NOTE — Progress Notes (Signed)
[facility], [place] [address]  Internal MEDICINE  Office Visit Note  Patient Name: Wendy Sexton  [id number]  [id number]  Date of Service: 05/26/2022  Chief Complaint  Patient presents with   Annual Exam   Anxiety     HPI Pt is here for routine health maintenance examination -Things have been doing well -Never started on crestor, still open to this but wants to go ahead and recheck labs first. Also need to recheck vitamins as these have been low previously -1ppd smoking, has used chantix previously and was able to quit before and would like to retry this. Has done wellbutrin before and didn't like it. -Due for mammogram and colon screening--prefers to do cologaurd and will order this, UTD on pap  Current Medication: Outpatient Encounter Medications as of 05/21/2022  Medication Sig   ergocalciferol (DRISDOL) 1.25 MG (50000 UT) capsule Take one cap q week   Fe Fum-FA-B Cmp-C-Zn-Mg-Mn-Cu (HEMOCYTE-PLUS) 106-1 MG TABS Take one tab a day for iron def anemia   levothyroxine (SYNTHROID) 50 MCG tablet TAKE 1 TABLET BY MOUTH EVERY DAY   rosuvastatin (CRESTOR) 5 MG tablet Take 1 tablet (5 mg total) by mouth daily.   triamcinolone cream (KENALOG) 0.1 % Apply 1 application. topically 2 (two) times daily.   Varenicline Tartrate, Starter, (CHANTIX STARTING MONTH PAK) 0.5 MG X 11 & 1 MG X 42 TBPK Take one 0.5 mg tablet by mouth once daily for 3 days, then increase to one 0.5 mg tablet twice daily for 4 days, then increase to one 1 mg tablet twice daily   [DISCONTINUED] varenicline (CHANTIX PAK) 0.5 MG X 11 & 1 MG X 42 tablet Take one 0.5 mg tablet by mouth once daily for 3 days, then increase to one 0.5 mg tablet twice daily for 4 days, then increase to one 1 mg tablet twice daily.   [DISCONTINUED] ALPRAZolam (XANAX) 0.5 MG tablet Take 1 tablet (0.5 mg total) by mouth 2 (two) times daily as needed for anxiety. (Patient not taking: Reported on 05/21/2022)    [DISCONTINUED] clotrimazole-betamethasone (LOTRISONE) cream Apply 1 application topically 2 (two) times daily. (Patient not taking: Reported on 05/21/2022)   [DISCONTINUED] doxycycline (VIBRA-TABS) 100 MG tablet Take 1 tablet (100 mg total) by mouth 2 (two) times daily for 10 days.   No facility-administered encounter medications on file as of 05/21/2022.    Surgical History: Past Surgical History:  Procedure Laterality Date   COLPOSCOPY  2010    Medical History: Past Medical History:  Diagnosis Date   Anxiety     Family History: Family History  Problem Relation Age of Onset   Hypertension Mother    Hypercholesterolemia Mother    Cancer Father    COPD Father    Hypertension Father    Hypercholesterolemia Father    Heart disease Paternal Grandfather    Hypercholesterolemia Paternal Grandfather    Hypertension Paternal Grandfather       Review of Systems  Constitutional:  Negative for chills, fatigue and unexpected weight change.  HENT:  Negative for congestion, postnasal drip, rhinorrhea, sneezing and sore throat.   Eyes:  Negative for redness.  Respiratory:  Negative for cough, chest tightness and shortness of breath.   Cardiovascular:  Negative for chest pain and palpitations.  Gastrointestinal:  Negative for abdominal pain, constipation, diarrhea, nausea and vomiting.  Genitourinary:  Negative for dysuria and frequency.  Musculoskeletal:  Negative for arthralgias, back pain, joint swelling and neck pain.  Skin:  Negative  for rash.  Neurological: Negative.  Negative for tremors and numbness.  Hematological:  Negative for adenopathy. Does not bruise/bleed easily.  Psychiatric/Behavioral:  Negative for behavioral problems (Depression), sleep disturbance and suicidal ideas. The patient is not nervous/anxious.      Vital Signs: BP 130/80   Pulse 97   Temp 98 F (36.7 C)   Resp 16   Ht 5\' 6"  (1.676 m)   Wt 183 lb (83 kg)   SpO2 99%   BMI 29.54 kg/m    Physical  Exam Vitals and nursing note reviewed.  Constitutional:      General: She is not in acute distress.    Appearance: Normal appearance. She is well-developed. She is not diaphoretic.  HENT:     Head: Normocephalic and atraumatic.     Mouth/Throat:     Pharynx: No oropharyngeal exudate.  Eyes:     Pupils: Pupils are equal, round, and reactive to light.  Neck:     Thyroid: No thyromegaly.     Vascular: No JVD.     Trachea: No tracheal deviation.  Cardiovascular:     Rate and Rhythm: Normal rate and regular rhythm.     Heart sounds: Normal heart sounds. No murmur heard.    No friction rub. No gallop.  Pulmonary:     Effort: Pulmonary effort is normal. No respiratory distress.     Breath sounds: No wheezing or rales.  Chest:     Chest wall: No tenderness.  Breasts:    Right: Normal. No mass.     Left: Normal. No mass.  Abdominal:     General: Bowel sounds are normal.     Palpations: Abdomen is soft.     Tenderness: There is no abdominal tenderness.  Musculoskeletal:        General: Normal range of motion.     Cervical back: Normal range of motion and neck supple.  Lymphadenopathy:     Cervical: No cervical adenopathy.  Skin:    General: Skin is warm and dry.  Neurological:     Mental Status: She is alert and oriented to person, place, and time.     Cranial Nerves: No cranial nerve deficit.  Psychiatric:        Behavior: Behavior normal.        Thought Content: Thought content normal.        Judgment: Judgment normal.      LABS: Recent Results (from the past 2160 hour(s))  UA/M w/rflx Culture, Routine     Status: Abnormal   Collection Time: 05/21/22  3:58 PM   Specimen: Urine   Urine  Result Value Ref Range   Specific Gravity, UA 1.009 1.005 - 1.030   pH, UA 6.0 5.0 - 7.5   Color, UA Yellow Yellow   Appearance Ur Clear Clear   Leukocytes,UA Negative Negative   Protein,UA Negative Negative/Trace   Glucose, UA Negative Negative   Ketones, UA Negative Negative    RBC, UA 1+ (A) Negative   Bilirubin, UA Negative Negative   Urobilinogen, Ur 0.2 0.2 - 1.0 mg/dL   Nitrite, UA Negative Negative   Microscopic Examination See below:     Comment: Microscopic was indicated and was performed.   Urinalysis Reflex Comment     Comment: This specimen will not reflex to a Urine Culture.  Microscopic Examination     Status: Abnormal   Collection Time: 05/21/22  3:58 PM   Urine  Result Value Ref Range   WBC, UA 0-5  0 - 5 /hpf   RBC, Urine 11-30 (A) 0 - 2 /hpf   Epithelial Cells (non renal) 0-10 0 - 10 /hpf   Casts None seen None seen /lpf   Bacteria, UA None seen None seen/Few        Assessment/Plan: 1. Encounter for general adult medical examination with abnormal findings CPE performed, routine fasting labs ordered, due for colon screening and mammogram  2. Hypothyroidism, unspecified type Continue synthroid and will update labs - TSH + free T4  3. Iron deficiency anemia due to chronic blood loss - Fe+TIBC+Fer  4. Cigarette nicotine dependence with other nicotine-induced disorder May retry chantix as she did well with this previously Smoking cessation counseling: Pt acknowledges the risks of long term smoking, she will try to quite smoking. Options for different medications including nicotine products, chewing gum, patch etc, Wellbutrin and Chantix is discussed Goal and date of compete cessation is discussed Total time spent in smoking cessation is 10 min.   5. Mixed hyperlipidemia Will recheck labs and consider starting crestor - Lipid Panel With LDL/HDL Ratio  6. Vitamin D deficiency - VITAMIN D 25 Hydroxy (Vit-D Deficiency, Fractures)  7. B12 deficiency - B12 and Folate Panel  8. Other fatigue - Lipid Panel With LDL/HDL Ratio - TSH + free T4 - CBC w/Diff/Platelet - Comprehensive metabolic panel - VITAMIN D 25 Hydroxy (Vit-D Deficiency, Fractures) - B12 and Folate Panel - Fe+TIBC+Fer  9. Screening for colorectal cancer -  Cologuard  10. Visit for screening mammogram - MM 3D SCREENING MAMMOGRAM BILATERAL BREAST; Future  11. Dysuria - UA/M w/rflx Culture, Routine   General Counseling: Wendy Sexton verbalizes understanding of the findings of todays visit and agrees with plan of treatment. I have discussed any further diagnostic evaluation that may be needed or ordered today. We also reviewed her medications today. she has been encouraged to call the office with any questions or concerns that should arise related to todays visit.    Counseling:    Orders Placed This Encounter  Procedures   Microscopic Examination   MM 3D SCREENING MAMMOGRAM BILATERAL BREAST   UA/M w/rflx Culture, Routine   Cologuard   Lipid Panel With LDL/HDL Ratio   TSH + free T4   CBC w/Diff/Platelet   Comprehensive metabolic panel   VITAMIN D 25 Hydroxy (Vit-D Deficiency, Fractures)   B12 and Folate Panel   Fe+TIBC+Fer    Meds ordered this encounter  Medications   Varenicline Tartrate, Starter, (CHANTIX STARTING MONTH PAK) 0.5 MG X 11 & 1 MG X 42 TBPK    Sig: Take one 0.5 mg tablet by mouth once daily for 3 days, then increase to one 0.5 mg tablet twice daily for 4 days, then increase to one 1 mg tablet twice daily    Dispense:  53 each    Refill:  0    This patient was seen by Drema Dallas, PA-C in collaboration with Dr. Clayborn Bigness as a part of collaborative care agreement.  Total time spent:35 Minutes  Time spent includes review of chart, medications, test results, and follow up plan with the patient.     Lavera Guise, MD  Internal Medicine

## 2022-05-22 LAB — UA/M W/RFLX CULTURE, ROUTINE
Bilirubin, UA: NEGATIVE
Glucose, UA: NEGATIVE
Ketones, UA: NEGATIVE
Leukocytes,UA: NEGATIVE
Nitrite, UA: NEGATIVE
Protein,UA: NEGATIVE
Specific Gravity, UA: 1.009 (ref 1.005–1.030)
Urobilinogen, Ur: 0.2 mg/dL (ref 0.2–1.0)
pH, UA: 6 (ref 5.0–7.5)

## 2022-05-22 LAB — MICROSCOPIC EXAMINATION
Bacteria, UA: NONE SEEN
Casts: NONE SEEN /lpf

## 2022-05-26 ENCOUNTER — Telehealth: Payer: Self-pay

## 2022-05-26 NOTE — Telephone Encounter (Signed)
-----   Message from Mylinda Latina, PA-C sent at 05/26/2022  9:26 AM EDT ----- Please let pt know that she had blood in her urine and ask if she was having her menstrual cycle which could explain this

## 2022-05-26 NOTE — Telephone Encounter (Signed)
Left message for patient regarding urine results.

## 2022-07-06 IMAGING — MG DIGITAL DIAGNOSTIC BILAT W/ TOMO W/ CAD
8 series · 8 of 24 positions shown · non-contrast
Comparison: Previous exam(s).

CLINICAL DATA: 45-year-old female presenting for annual bilateral
mammogram and final follow-up of a probably benign left breast
asymmetry.

EXAM:
DIGITAL DIAGNOSTIC BILATERAL MAMMOGRAM WITH TOMOSYNTHESIS AND CAD
TECHNIQUE: Bilateral digital diagnostic mammography and breast tomosynthesis
was performed. The images were evaluated with computer-aided
detection.

[L MLO synth-2D]
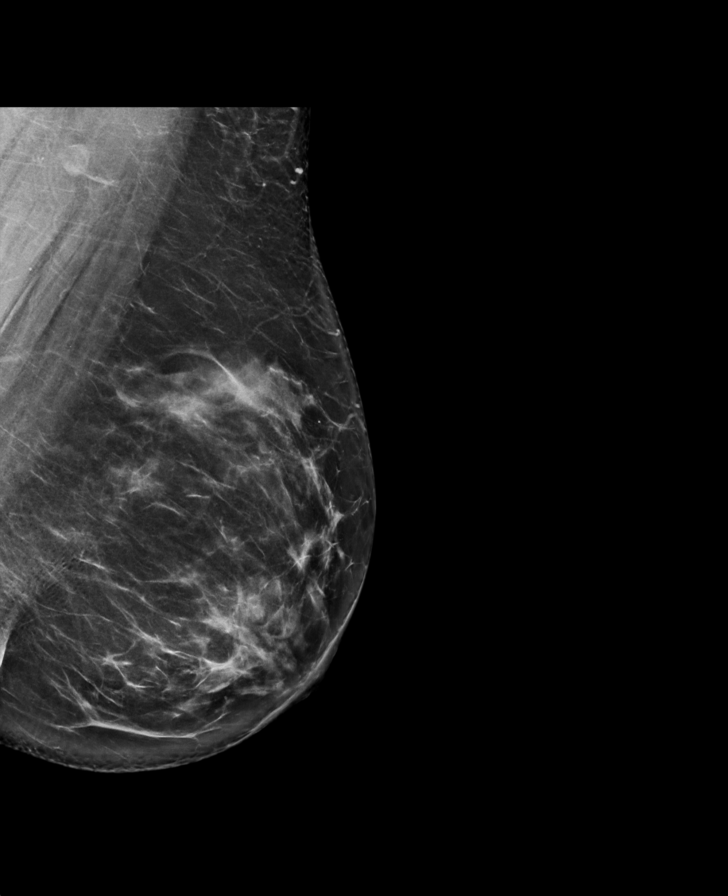

[L CC synth-2D]
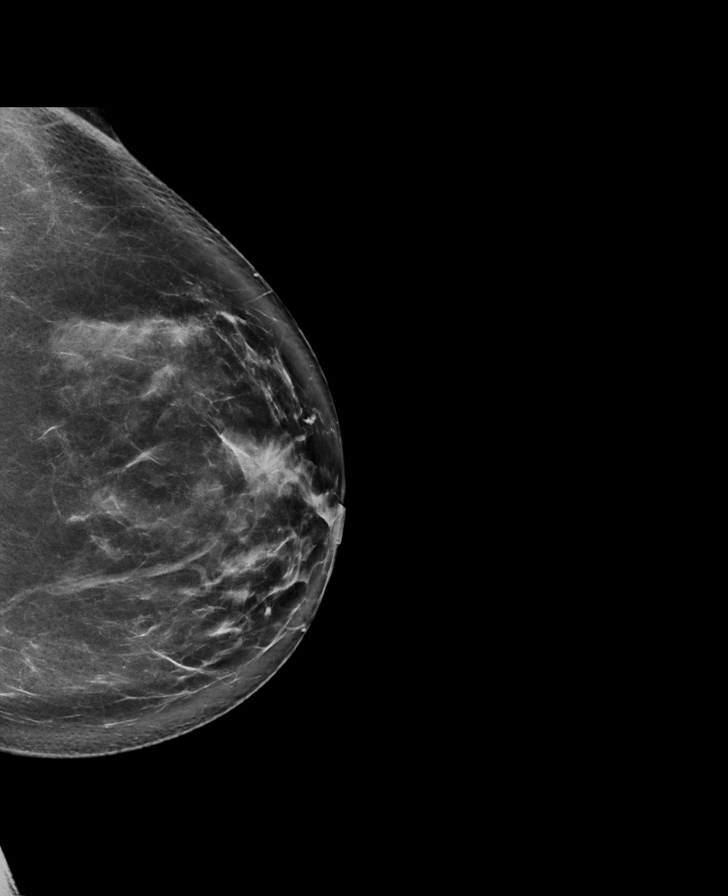

[R CC synth-2D]
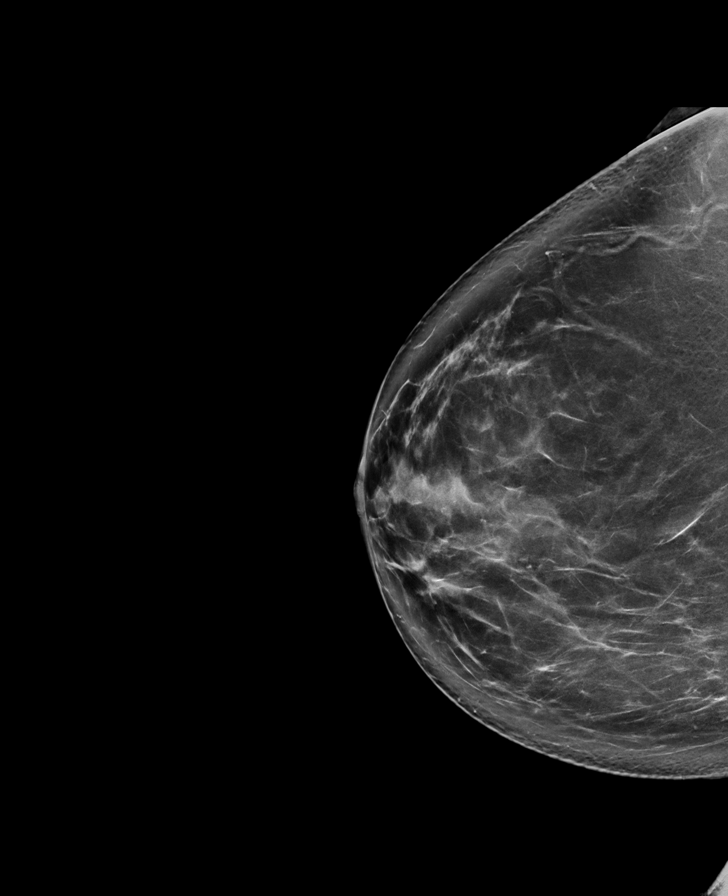

[R MLO synth-2D]
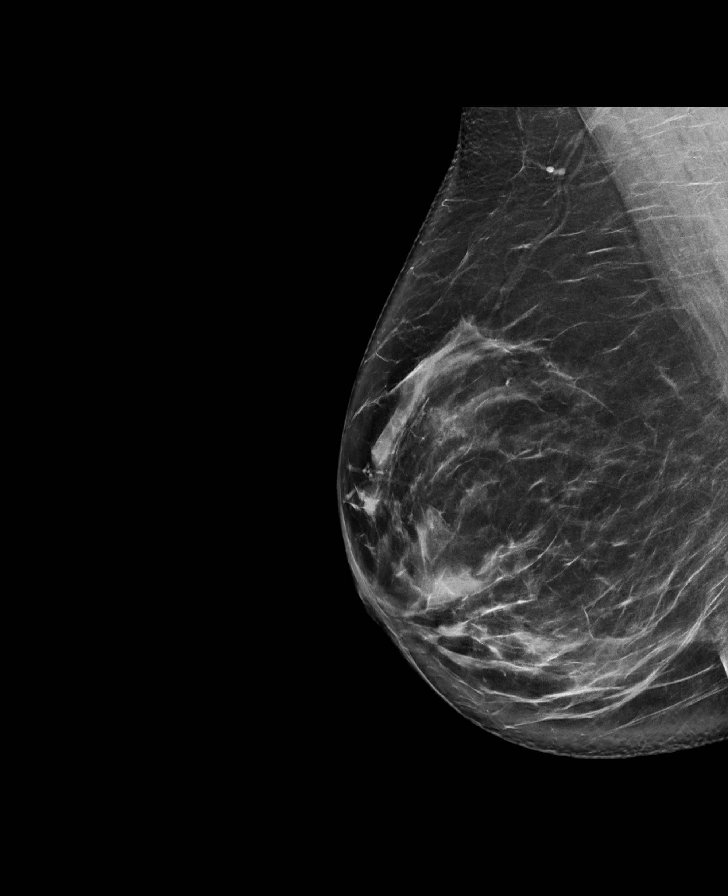

[R MLO tomo · tomo slice 47/94.0]
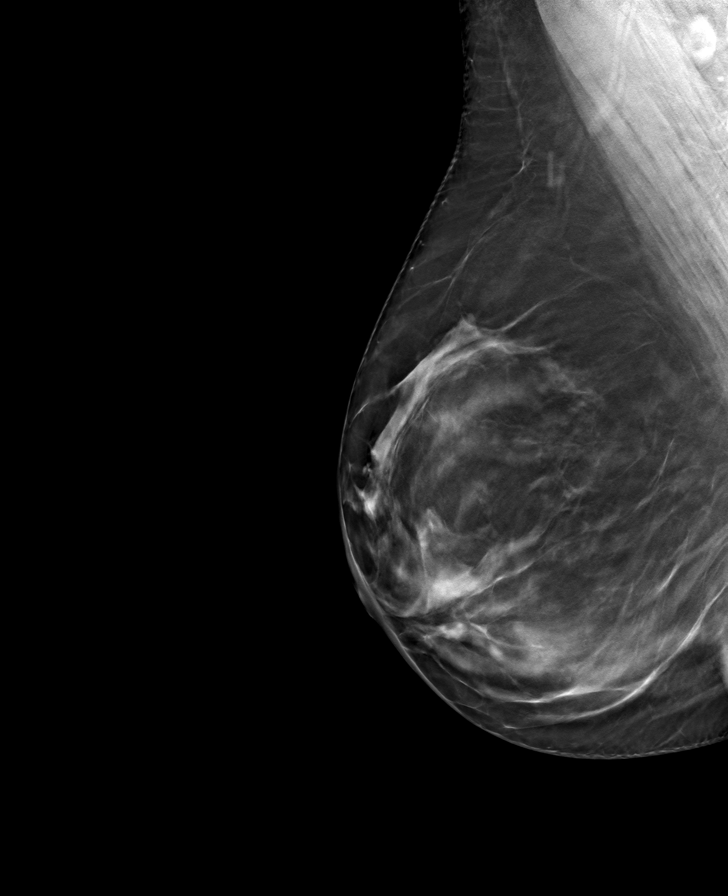

[L MLO tomo · tomo slice 47/93.0]
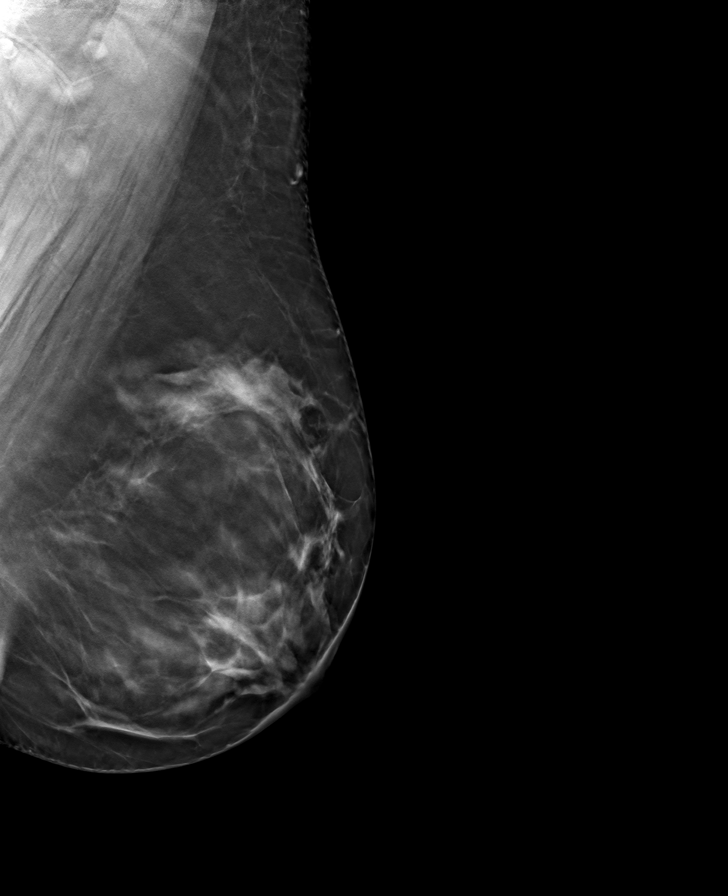

[R CC tomo · tomo slice 49/96.0]
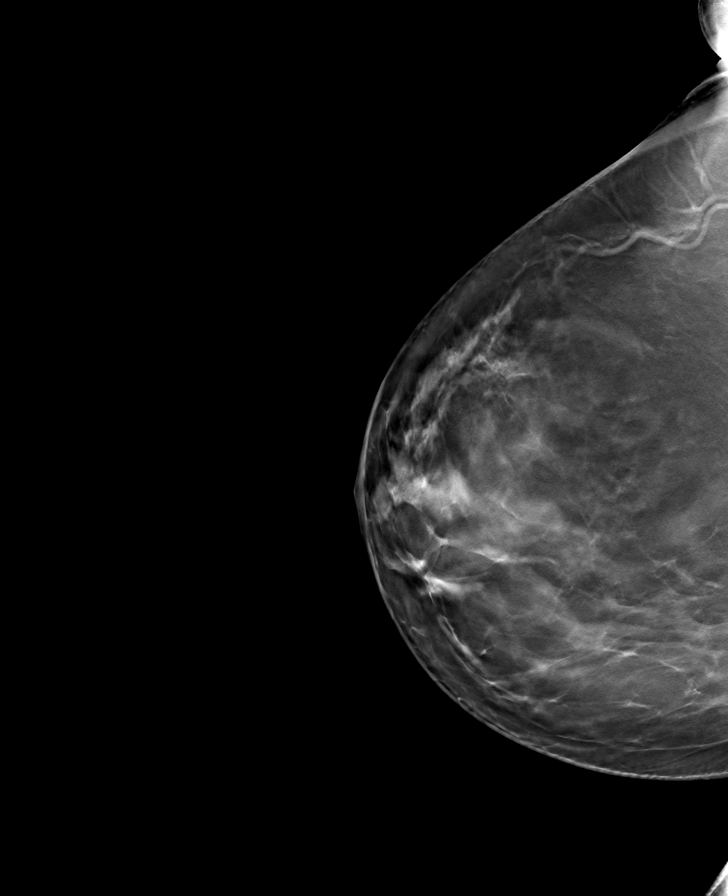

[L CC tomo · tomo slice 47/92.0]
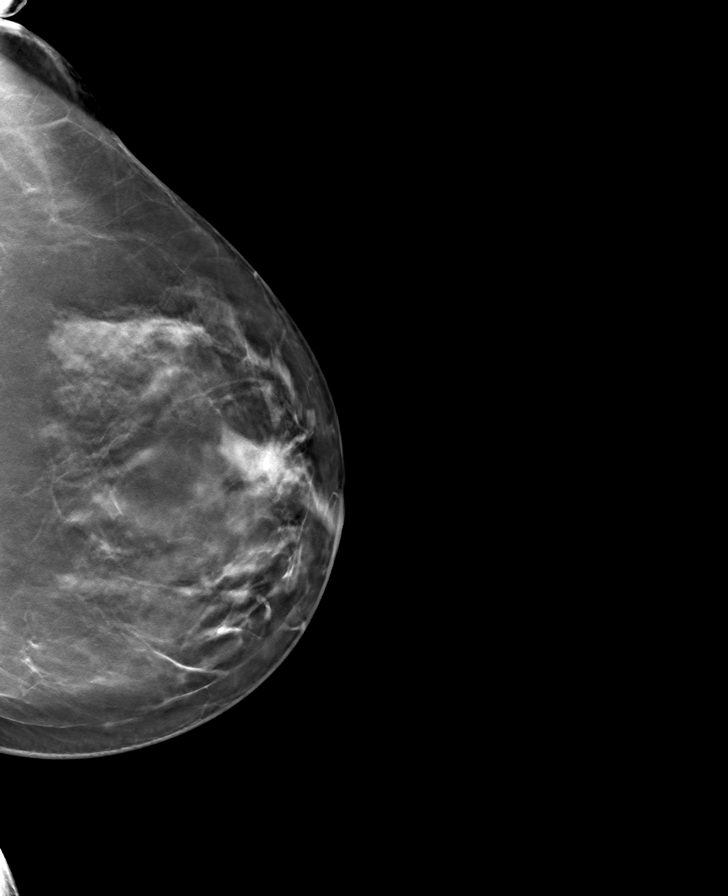

[8 of 24 positions shown; findings below may reference images not displayed]

ACR Breast Density Category b: There are scattered areas of
fibroglandular density.
FINDINGS: Focal asymmetry in the upper outer left breast is less conspicuous
on today's views. No new or suspicious findings in either breast.
IMPRESSION: 1. No mammographic evidence of malignancy in either breast.
2. Benign left breast focal asymmetry. No further follow-up
required.

RECOMMENDATION:
Screening mammogram in one year.(Code:D1-[id number])

I have discussed the findings and recommendations with the patient.
If applicable, a reminder letter will be sent to the patient
regarding the next appointment.

BI-RADS CATEGORY  2: Benign.

## 2022-11-23 ENCOUNTER — Ambulatory Visit: Payer: BC Managed Care – PPO | Admitting: Physician Assistant

## 2022-11-30 ENCOUNTER — Ambulatory Visit: Payer: BC Managed Care – PPO | Admitting: Physician Assistant

## 2022-12-03 DIAGNOSIS — D500 Iron deficiency anemia secondary to blood loss (chronic): Secondary | ICD-10-CM | POA: Diagnosis not present

## 2022-12-03 DIAGNOSIS — E559 Vitamin D deficiency, unspecified: Secondary | ICD-10-CM | POA: Diagnosis not present

## 2022-12-03 DIAGNOSIS — E782 Mixed hyperlipidemia: Secondary | ICD-10-CM | POA: Diagnosis not present

## 2022-12-03 DIAGNOSIS — E039 Hypothyroidism, unspecified: Secondary | ICD-10-CM | POA: Diagnosis not present

## 2022-12-03 DIAGNOSIS — R5383 Other fatigue: Secondary | ICD-10-CM | POA: Diagnosis not present

## 2022-12-03 DIAGNOSIS — E538 Deficiency of other specified B group vitamins: Secondary | ICD-10-CM | POA: Diagnosis not present

## 2022-12-04 LAB — COMPREHENSIVE METABOLIC PANEL
ALT: 59 [IU]/L — ABNORMAL HIGH (ref 0–32)
AST: 48 [IU]/L — ABNORMAL HIGH (ref 0–40)
Albumin: 4.7 g/dL (ref 3.9–4.9)
Alkaline Phosphatase: 79 [IU]/L (ref 44–121)
BUN/Creatinine Ratio: 12 (ref 9–23)
BUN: 7 mg/dL (ref 6–24)
Bilirubin Total: 0.2 mg/dL (ref 0.0–1.2)
CO2: 20 mmol/L (ref 20–29)
Calcium: 9.6 mg/dL (ref 8.7–10.2)
Chloride: 103 mmol/L (ref 96–106)
Creatinine, Ser: 0.6 mg/dL (ref 0.57–1.00)
Globulin, Total: 2.4 g/dL (ref 1.5–4.5)
Glucose: 97 mg/dL (ref 70–99)
Potassium: 4.7 mmol/L (ref 3.5–5.2)
Sodium: 138 mmol/L (ref 134–144)
Total Protein: 7.1 g/dL (ref 6.0–8.5)
eGFR: 111 mL/min/{1.73_m2} (ref >59)

## 2022-12-04 LAB — CBC WITH DIFFERENTIAL/PLATELET
Basophils Absolute: 0.1 10*3/uL (ref 0.0–0.2)
Basos: 1 %
EOS (ABSOLUTE): 0.4 10*3/uL (ref 0.0–0.4)
Eos: 4 %
Hematocrit: 39.8 % (ref 34.0–46.6)
Hemoglobin: 12.6 g/dL (ref 11.1–15.9)
Immature Grans (Abs): 0 10*3/uL (ref 0.0–0.1)
Immature Granulocytes: 0 %
Lymphocytes Absolute: 1.4 10*3/uL (ref 0.7–3.1)
Lymphs: 16 %
MCH: 25.7 pg — ABNORMAL LOW (ref 26.6–33.0)
MCHC: 31.7 g/dL (ref 31.5–35.7)
MCV: 81 fL (ref 79–97)
Monocytes Absolute: 0.8 10*3/uL (ref 0.1–0.9)
Monocytes: 9 %
Neutrophils Absolute: 5.8 10*3/uL (ref 1.4–7.0)
Neutrophils: 70 %
Platelets: 392 10*3/uL (ref 150–450)
RBC: 4.91 x10E6/uL (ref 3.77–5.28)
RDW: 14.5 % (ref 11.7–15.4)
WBC: 8.4 10*3/uL (ref 3.4–10.8)

## 2022-12-04 LAB — LIPID PANEL WITH LDL/HDL RATIO
Cholesterol, Total: 278 mg/dL — ABNORMAL HIGH (ref 100–199)
HDL: 69 mg/dL (ref >39)
LDL Chol Calc (NIH): 184 mg/dL — ABNORMAL HIGH (ref 0–99)
LDL/HDL Ratio: 2.7 {ratio} (ref 0.0–3.2)
Triglycerides: 139 mg/dL (ref 0–149)
VLDL Cholesterol Cal: 25 mg/dL (ref 5–40)

## 2022-12-04 LAB — TSH+FREE T4
Free T4: 0.82 ng/dL (ref 0.82–1.77)
TSH: 4.52 u[IU]/mL — ABNORMAL HIGH (ref 0.450–4.500)

## 2022-12-04 LAB — IRON,TIBC AND FERRITIN PANEL
Ferritin: 13 ng/mL — ABNORMAL LOW (ref 15–150)
Iron Saturation: 7 % — CL (ref 15–55)
Iron: 34 ug/dL (ref 27–159)
Total Iron Binding Capacity: 467 ug/dL — ABNORMAL HIGH (ref 250–450)
UIBC: 433 ug/dL — ABNORMAL HIGH (ref 131–425)

## 2022-12-04 LAB — VITAMIN D 25 HYDROXY (VIT D DEFICIENCY, FRACTURES): Vit D, 25-Hydroxy: 15.8 ng/mL — ABNORMAL LOW (ref 30.0–100.0)

## 2022-12-04 LAB — B12 AND FOLATE PANEL
Folate: 8.7 ng/mL (ref >3.0)
Vitamin B-12: 422 pg/mL (ref 232–1245)

## 2022-12-07 ENCOUNTER — Encounter: Payer: Self-pay | Admitting: Physician Assistant

## 2022-12-07 ENCOUNTER — Ambulatory Visit (INDEPENDENT_AMBULATORY_CARE_PROVIDER_SITE_OTHER): Payer: BC Managed Care – PPO | Admitting: Physician Assistant

## 2022-12-07 VITALS — BP 138/88 | HR 93 | Temp 97.7°F | Resp 16 | Ht 66.0 in | Wt 195.0 lb

## 2022-12-07 DIAGNOSIS — E782 Mixed hyperlipidemia: Secondary | ICD-10-CM | POA: Diagnosis not present

## 2022-12-07 DIAGNOSIS — E559 Vitamin D deficiency, unspecified: Secondary | ICD-10-CM

## 2022-12-07 DIAGNOSIS — E039 Hypothyroidism, unspecified: Secondary | ICD-10-CM

## 2022-12-07 DIAGNOSIS — D500 Iron deficiency anemia secondary to blood loss (chronic): Secondary | ICD-10-CM

## 2022-12-07 DIAGNOSIS — F419 Anxiety disorder, unspecified: Secondary | ICD-10-CM

## 2022-12-07 DIAGNOSIS — L309 Dermatitis, unspecified: Secondary | ICD-10-CM

## 2022-12-07 DIAGNOSIS — R7989 Other specified abnormal findings of blood chemistry: Secondary | ICD-10-CM

## 2022-12-07 MED ORDER — TRIAMCINOLONE ACETONIDE 0.1 % EX CREA: 1.0000 | TOPICAL_CREAM | Freq: Two times a day (BID) | CUTANEOUS | 0 refills | Status: AC

## 2022-12-07 MED ORDER — ROSUVASTATIN CALCIUM 5 MG PO TABS
5.0000 mg | ORAL_TABLET | Freq: Every day | ORAL | 3 refills | Status: DC
Start: 2022-12-07 — End: 2024-01-26

## 2022-12-07 MED ORDER — ERGOCALCIFEROL 1.25 MG (50000 UT) PO CAPS: ORAL_CAPSULE | ORAL | 3 refills | Status: AC

## 2022-12-07 MED ORDER — ALPRAZOLAM 0.25 MG PO TABS
0.2500 mg | ORAL_TABLET | Freq: Every day | ORAL | 0 refills | Status: AC | PRN
Start: 2022-12-07 — End: ?

## 2022-12-07 MED ORDER — LEVOTHYROXINE SODIUM 50 MCG PO TABS
50.0000 ug | ORAL_TABLET | Freq: Every day | ORAL | 1 refills | Status: DC
Start: 2022-12-07 — End: 2023-07-19

## 2022-12-07 MED ORDER — HEMOCYTE-PLUS 106-1 MG PO TABS: ORAL_TABLET | ORAL | 3 refills | Status: AC

## 2022-12-07 NOTE — Progress Notes (Signed)
[facility] [address] [place], [place] [address]  Internal MEDICINE  Office Visit Note  Patient Name: Wendy Sexton  [id number]  [id number]  Date of Service: 12/12/2022  Chief Complaint  Patient presents with   Follow-up    HPI Pt is here for routine follow up -admits she has not been taking her medication recently and likely impacted labs -labs reviewed: iron still low--not taking supplement anymore and will restart. -Cholesterol-- not taking the crestor and will start now -Tsh high and borderline low T4--has been without synthroid recently, will start back on this  -stopped smoking in May, but was drinking a little more since then and will scale back on this--as LFTs borderline elevated -vit D low--start drisdol -does state last urine was on cycle which explains blood -Feeling more anxious recently, was on xanax previously would like to have on hand just in case  Current Medication: Outpatient Encounter Medications as of 12/07/2022  Medication Sig   ALPRAZolam (XANAX) 0.25 MG tablet Take 1 tablet (0.25 mg total) by mouth daily as needed for anxiety.   Varenicline Tartrate, Starter, (CHANTIX STARTING MONTH PAK) 0.5 MG X 11 & 1 MG X 42 TBPK Take one 0.5 mg tablet by mouth once daily for 3 days, then increase to one 0.5 mg tablet twice daily for 4 days, then increase to one 1 mg tablet twice daily   [DISCONTINUED] ergocalciferol (DRISDOL) 1.25 MG (50000 UT) capsule Take one cap q week   [DISCONTINUED] Fe Fum-FA-B Cmp-C-Zn-Mg-Mn-Cu (HEMOCYTE-PLUS) 106-1 MG TABS Take one tab a day for iron def anemia   [DISCONTINUED] levothyroxine (SYNTHROID) 50 MCG tablet TAKE 1 TABLET BY MOUTH EVERY DAY   [DISCONTINUED] rosuvastatin (CRESTOR) 5 MG tablet Take 1 tablet (5 mg total) by mouth daily.   [DISCONTINUED] triamcinolone cream (KENALOG) 0.1 % Apply 1 application. topically 2 (two) times daily.   ergocalciferol (DRISDOL) 1.25 MG (50000 UT) capsule Take one cap q week   Fe  Fum-FA-B Cmp-C-Zn-Mg-Mn-Cu (HEMOCYTE-PLUS) 106-1 MG TABS Take one tab a day for iron def anemia   levothyroxine (SYNTHROID) 50 MCG tablet Take 1 tablet (50 mcg total) by mouth daily.   rosuvastatin (CRESTOR) 5 MG tablet Take 1 tablet (5 mg total) by mouth daily.   triamcinolone cream (KENALOG) 0.1 % Apply 1 Application topically 2 (two) times daily.   No facility-administered encounter medications on file as of 12/07/2022.    Surgical History: Past Surgical History:  Procedure Laterality Date   COLPOSCOPY  2010    Medical History: Past Medical History:  Diagnosis Date   Anxiety     Family History: Family History  Problem Relation Age of Onset   Hypertension Mother    Hypercholesterolemia Mother    Cancer Father    COPD Father    Hypertension Father    Hypercholesterolemia Father    Heart disease Paternal Grandfather    Hypercholesterolemia Paternal Grandfather    Hypertension Paternal Grandfather     Social History   Socioeconomic History   Marital status: Single    Spouse name: Not on file   Number of children: Not on file   Years of education: Not on file   Highest education level: Not on file  Occupational History   Not on file  Tobacco Use   Smoking status: Former    Current packs/day: 0.00    Types: Cigarettes    Quit date: 04/30/2020    Years since quitting: 2.6   Smokeless tobacco: Former  Advertising account planner  Vaping status: Never Used  Substance and Sexual Activity   Alcohol use: Yes    Comment: socially   Drug use: No   Sexual activity: Yes  Other Topics Concern   Not on file  Social History Narrative   Not on file   Social Determinants of Health   Financial Resource Strain: Not on file  Food Insecurity: Not on file  Transportation Needs: Not on file  Physical Activity: Not on file  Stress: Not on file  Social Connections: Not on file  Intimate Partner Violence: Not on file      Review of Systems  Constitutional:  Negative for chills, fatigue  and unexpected weight change.  HENT:  Negative for congestion, postnasal drip, rhinorrhea, sneezing and sore throat.   Eyes:  Negative for redness.  Respiratory:  Negative for cough, chest tightness and shortness of breath.   Cardiovascular:  Negative for chest pain and palpitations.  Gastrointestinal:  Negative for abdominal pain, constipation, diarrhea, nausea and vomiting.  Genitourinary:  Negative for dysuria and frequency.  Musculoskeletal:  Negative for arthralgias, back pain, joint swelling and neck pain.  Skin:  Negative for rash.  Neurological: Negative.  Negative for tremors and numbness.  Hematological:  Negative for adenopathy. Does not bruise/bleed easily.  Psychiatric/Behavioral:  Negative for behavioral problems (Depression), sleep disturbance and suicidal ideas. The patient is not nervous/anxious.     Vital Signs: BP 138/88 Comment: 140/80  Pulse 93   Temp 97.7 F (36.5 C)   Resp 16   Ht 5\' 6"  (1.676 m)   Wt 195 lb (88.5 kg)   SpO2 97%   BMI 31.47 kg/m    Physical Exam Vitals and nursing note reviewed.  Constitutional:      General: She is not in acute distress.    Appearance: Normal appearance. She is well-developed. She is not diaphoretic.  HENT:     Head: Normocephalic and atraumatic.     Mouth/Throat:     Pharynx: No oropharyngeal exudate.  Eyes:     Pupils: Pupils are equal, round, and reactive to light.  Neck:     Thyroid: No thyromegaly.     Vascular: No JVD.     Trachea: No tracheal deviation.  Cardiovascular:     Rate and Rhythm: Normal rate and regular rhythm.     Heart sounds: Normal heart sounds. No murmur heard.    No friction rub. No gallop.  Pulmonary:     Effort: Pulmonary effort is normal. No respiratory distress.     Breath sounds: No wheezing or rales.  Chest:     Chest wall: No tenderness.  Breasts:    Right: Normal. No mass.     Left: Normal. No mass.  Abdominal:     General: Bowel sounds are normal.     Palpations: Abdomen  is soft.  Musculoskeletal:        General: Normal range of motion.     Cervical back: Normal range of motion and neck supple.  Lymphadenopathy:     Cervical: No cervical adenopathy.  Skin:    General: Skin is warm and dry.  Neurological:     Mental Status: She is alert and oriented to person, place, and time.     Cranial Nerves: No cranial nerve deficit.  Psychiatric:        Behavior: Behavior normal.        Thought Content: Thought content normal.        Judgment: Judgment normal.  Assessment/Plan: 1. Vitamin D deficiency Will start on drisdol - ergocalciferol (DRISDOL) 1.25 MG (50000 UT) capsule; Take one cap q week  Dispense: 12 capsule; Refill: 3  2. Hypothyroidism, unspecified type Will start back on synthroid and recheck labs in 2 months - levothyroxine (SYNTHROID) 50 MCG tablet; Take 1 tablet (50 mcg total) by mouth daily.  Dispense: 90 tablet; Refill: 1 - TSH + free T4  3. Mixed hyperlipidemia Will start crestor - rosuvastatin (CRESTOR) 5 MG tablet; Take 1 tablet (5 mg total) by mouth daily.  Dispense: 90 tablet; Refill: 3  4. Iron deficiency anemia due to chronic blood loss Will start back on supplement and recheck labs - Fe Fum-FA-B Cmp-C-Zn-Mg-Mn-Cu (HEMOCYTE-PLUS) 106-1 MG TABS; Take one tab a day for iron def anemia  Dispense: 30 tablet; Refill: 3 - Fe+TIBC+Fer  5. Anxiety May take xanax as needed - ALPRAZolam (XANAX) 0.25 MG tablet; Take 1 tablet (0.25 mg total) by mouth daily as needed for anxiety.  Dispense: 20 tablet; Refill: 0  6. Eczema of both hands - triamcinolone cream (KENALOG) 0.1 %; Apply 1 Application topically 2 (two) times daily.  Dispense: 30 g; Refill: 0  7. Elevated LFTs Will reduce alcohol intake and recheck labs - Hepatic function panel   General Counseling: Mikinzie verbalizes understanding of the findings of todays visit and agrees with plan of treatment. I have discussed any further diagnostic evaluation that may be needed or  ordered today. We also reviewed her medications today. she has been encouraged to call the office with any questions or concerns that should arise related to todays visit.    Orders Placed This Encounter  Procedures   TSH + free T4   Fe+TIBC+Fer   Hepatic function panel    Meds ordered this encounter  Medications   ergocalciferol (DRISDOL) 1.25 MG (50000 UT) capsule    Sig: Take one cap q week    Dispense:  12 capsule    Refill:  3   levothyroxine (SYNTHROID) 50 MCG tablet    Sig: Take 1 tablet (50 mcg total) by mouth daily.    Dispense:  90 tablet    Refill:  1   rosuvastatin (CRESTOR) 5 MG tablet    Sig: Take 1 tablet (5 mg total) by mouth daily.    Dispense:  90 tablet    Refill:  3   Fe Fum-FA-B Cmp-C-Zn-Mg-Mn-Cu (HEMOCYTE-PLUS) 106-1 MG TABS    Sig: Take one tab a day for iron def anemia    Dispense:  30 tablet    Refill:  3   ALPRAZolam (XANAX) 0.25 MG tablet    Sig: Take 1 tablet (0.25 mg total) by mouth daily as needed for anxiety.    Dispense:  20 tablet    Refill:  0   triamcinolone cream (KENALOG) 0.1 %    Sig: Apply 1 Application topically 2 (two) times daily.    Dispense:  30 g    Refill:  0    This patient was seen by Lynn Ito, PA-C in collaboration with Dr. Beverely Risen as a part of collaborative care agreement.   Total time spent:30 Minutes Time spent includes review of chart, medications, test results, and follow up plan with the patient.      Dr Lyndon Code Internal medicine

## 2023-02-01 ENCOUNTER — Telehealth: Payer: Self-pay | Admitting: Physician Assistant

## 2023-02-01 NOTE — Telephone Encounter (Signed)
Lvm regarding labs not being drawn-Toni

## 2023-02-08 ENCOUNTER — Ambulatory Visit: Payer: BC Managed Care – PPO | Admitting: Physician Assistant

## 2023-02-10 ENCOUNTER — Ambulatory Visit
Admission: RE | Admit: 2023-02-10 | Discharge: 2023-02-10 | Disposition: A | Discharge status: Home / Self Care | Payer: BC Managed Care – PPO | Source: Ambulatory Visit | Attending: Physician Assistant | Admitting: Physician Assistant

## 2023-02-10 ENCOUNTER — Encounter: Payer: Self-pay | Admitting: Radiology

## 2023-02-10 DIAGNOSIS — Z1231 Encounter for screening mammogram for malignant neoplasm of breast: Secondary | ICD-10-CM | POA: Diagnosis not present

## 2023-03-15 ENCOUNTER — Ambulatory Visit: Payer: BC Managed Care – PPO | Admitting: Physician Assistant

## 2023-04-15 ENCOUNTER — Ambulatory Visit: Payer: BC Managed Care – PPO | Admitting: Physician Assistant

## 2023-04-19 ENCOUNTER — Telehealth: Payer: Self-pay | Admitting: Physician Assistant

## 2023-04-19 NOTE — Telephone Encounter (Signed)
Lvm & sent mychart msg to move 05/21/23 appointment-Toni

## 2023-05-21 ENCOUNTER — Encounter: Payer: BC Managed Care – PPO | Admitting: Physician Assistant

## 2023-06-14 ENCOUNTER — Telehealth: Payer: Self-pay | Admitting: Physician Assistant

## 2023-06-14 NOTE — Telephone Encounter (Signed)
 Left vm and sent mychart message to confirm 06/21/23 appointment-Toni

## 2023-06-21 ENCOUNTER — Encounter: Admitting: Physician Assistant

## 2023-07-19 ENCOUNTER — Other Ambulatory Visit: Payer: Self-pay | Admitting: Physician Assistant

## 2023-07-19 DIAGNOSIS — E039 Hypothyroidism, unspecified: Secondary | ICD-10-CM

## 2023-08-05 ENCOUNTER — Telehealth: Payer: Self-pay

## 2023-08-05 NOTE — Telephone Encounter (Signed)
 Lmom to call us  back need appt due to she missed and no show few appt

## 2023-10-28 ENCOUNTER — Other Ambulatory Visit: Payer: Self-pay | Admitting: Physician Assistant

## 2023-10-28 DIAGNOSIS — E039 Hypothyroidism, unspecified: Secondary | ICD-10-CM

## 2023-10-28 NOTE — Telephone Encounter (Signed)
 PT NEED APPT FOR FURTHER REFILLS AND 90 DAYS

## 2023-11-21 ENCOUNTER — Other Ambulatory Visit: Payer: Self-pay | Admitting: Physician Assistant

## 2023-11-21 DIAGNOSIS — E039 Hypothyroidism, unspecified: Secondary | ICD-10-CM

## 2024-01-25 ENCOUNTER — Other Ambulatory Visit: Payer: Self-pay | Admitting: Physician Assistant

## 2024-01-25 DIAGNOSIS — E782 Mixed hyperlipidemia: Secondary | ICD-10-CM

## 2024-03-01 ENCOUNTER — Other Ambulatory Visit: Payer: Self-pay | Admitting: Physician Assistant

## 2024-03-01 DIAGNOSIS — E782 Mixed hyperlipidemia: Secondary | ICD-10-CM

## 2024-03-05 ENCOUNTER — Other Ambulatory Visit: Payer: Self-pay | Admitting: Physician Assistant

## 2024-03-05 DIAGNOSIS — E782 Mixed hyperlipidemia: Secondary | ICD-10-CM
